# Patient Record
Sex: Female | Born: 1970 | Race: White | Hispanic: No | Marital: Married | Smoking: Current every day smoker
Health system: Southern US, Community
[De-identification: ages and names within clinical notes are randomized; demographics above are authoritative.]

## PROBLEM LIST (undated history)

## (undated) DIAGNOSIS — M1990 Unspecified osteoarthritis, unspecified site: Secondary | ICD-10-CM

## (undated) DIAGNOSIS — E119 Type 2 diabetes mellitus without complications: Secondary | ICD-10-CM

## (undated) HISTORY — DX: Type 2 diabetes mellitus without complications: E11.9

## (undated) HISTORY — PX: ABDOMINAL HYSTERECTOMY: SHX81

---

## 2016-12-11 ENCOUNTER — Emergency Department (INDEPENDENT_AMBULATORY_CARE_PROVIDER_SITE_OTHER)
Admission: EM | Admit: 2016-12-11 | Discharge: 2016-12-11 | Disposition: A | Discharge status: Home / Self Care | Payer: BLUE CROSS/BLUE SHIELD | Source: Home / Self Care | Attending: Family Medicine | Admitting: Family Medicine

## 2016-12-11 ENCOUNTER — Encounter: Payer: Self-pay | Admitting: *Deleted

## 2016-12-11 ENCOUNTER — Emergency Department: Payer: BLUE CROSS/BLUE SHIELD

## 2016-12-11 ENCOUNTER — Telehealth: Payer: Self-pay | Admitting: *Deleted

## 2016-12-11 DIAGNOSIS — M7989 Other specified soft tissue disorders: Secondary | ICD-10-CM

## 2016-12-11 DIAGNOSIS — E119 Type 2 diabetes mellitus without complications: Secondary | ICD-10-CM

## 2016-12-11 DIAGNOSIS — K219 Gastro-esophageal reflux disease without esophagitis: Secondary | ICD-10-CM

## 2016-12-11 DIAGNOSIS — L42 Pityriasis rosea: Secondary | ICD-10-CM

## 2016-12-11 DIAGNOSIS — M797 Fibromyalgia: Secondary | ICD-10-CM | POA: Diagnosis not present

## 2016-12-11 DIAGNOSIS — E8941 Symptomatic postprocedural ovarian failure: Secondary | ICD-10-CM

## 2016-12-11 HISTORY — DX: Unspecified osteoarthritis, unspecified site: M19.90

## 2016-12-11 LAB — POCT URINALYSIS DIP (MANUAL ENTRY)
BILIRUBIN UA: NEGATIVE
BILIRUBIN UA: NEGATIVE mg/dL
Blood, UA: NEGATIVE
GLUCOSE UA: NEGATIVE mg/dL
LEUKOCYTES UA: NEGATIVE
NITRITE UA: NEGATIVE
PH UA: 5.5 (ref 5.0–8.0)
Protein Ur, POC: NEGATIVE mg/dL
Spec Grav, UA: 1.025 (ref 1.010–1.025)
Urobilinogen, UA: 0.2 E.U./dL

## 2016-12-11 LAB — POCT CBC W AUTO DIFF (K'VILLE URGENT CARE)

## 2016-12-11 LAB — COMPLETE METABOLIC PANEL WITH GFR
ALBUMIN: 3.3 g/dL — AB (ref 3.6–5.1)
ALT: 24 U/L (ref 6–29)
AST: 21 U/L (ref 10–35)
Alkaline Phosphatase: 80 U/L (ref 33–115)
BUN: 9 mg/dL (ref 7–25)
CALCIUM: 9.3 mg/dL (ref 8.6–10.2)
CHLORIDE: 105 mmol/L (ref 98–110)
CO2: 29 mmol/L (ref 20–31)
CREATININE: 0.77 mg/dL (ref 0.50–1.10)
GFR, Est African American: >89 mL/min (ref >=60)
GFR, Est Non African American: >89 mL/min (ref >=60)
GLUCOSE: 90 mg/dL (ref 65–99)
Potassium: 5.3 mmol/L (ref 3.5–5.3)
SODIUM: 144 mmol/L (ref 135–146)
Total Bilirubin: 0.2 mg/dL (ref 0.2–1.2)
Total Protein: 6.4 g/dL (ref 6.1–8.1)

## 2016-12-11 LAB — TSH: TSH: 2.77 m[IU]/L

## 2016-12-11 MED ORDER — PREGABALIN 25 MG PO CAPS: ORAL_CAPSULE | ORAL | 1 refills | Status: DC

## 2016-12-11 MED ORDER — METFORMIN HCL 500 MG PO TABS: 500.0000 mg | ORAL_TABLET | Freq: Two times a day (BID) | ORAL | 1 refills | Status: AC

## 2016-12-11 MED ORDER — ESOMEPRAZOLE MAGNESIUM 40 MG PO CPDR: DELAYED_RELEASE_CAPSULE | ORAL | 1 refills | Status: AC

## 2016-12-11 MED ORDER — GABAPENTIN 300 MG PO CAPS: 300.0000 mg | ORAL_CAPSULE | Freq: Every day | ORAL | 0 refills | Status: DC

## 2016-12-11 NOTE — ED Triage Notes (Signed)
Patient c/o of a "several month" h/o right leg pain and swelling, generalized body aches, pelvic pain and hot/cold flashes. Reports previous diagnosis of diabetes and fibromyalgia. She has not been able to get any Rx because she has been uninsured.

## 2016-12-11 NOTE — Telephone Encounter (Signed)
Patient would like Lyrica changed to alternate medication as she does not wish to wait on prior authorization request. Per Dr. Cathren HarshBeese, change to Gabapentin 300mg  1 daily. #15. F/u with new PCP for further refills

## 2016-12-11 NOTE — ED Provider Notes (Addendum)
[facility]    CSN: [id number] Arrival date & time: 12/11/16  1138     History   Chief Complaint Chief Complaint  Patient presents with  . Generalized Body Aches  . Pelvic Pain  . Leg Swelling    HPI Khadija Thier is a 46 y.o. female.   Patient presents with her mother with numerous complaints.  She lost her health insurance about a year ago, and has been unable to obtain medications/treatment/evaluation for some of her problems.  She is seeking a PCP to manage her medical conditions. 1)  Type 2 diabetes.  Patient had been started on metformin about a year ago, but does not remember her dosage or response.  She also does not remember her Hgb A1c.  She admits that recently she has developed urinary frequency and nocturia without dysuria.  Her weight has increased from about 140 pounds a year ago to 217 pounds today.  She complains of vague blurred vision. 2)  Fibromyalgia.  She complains of chronic upper and lower back pain.  She had been prescribed a combination of Cymbalta and Lyrica in the past, but recalls that she did not like how she felt while on the meds (she does not recall the dosages).  She presently is taking Pristiq 50mg  daily.  She is also receiving Percocet 10-325, one tab TID through a pain clinic [facility] Marijean Niemann., MD).  According to the [facility], she last received a 30 day supply (90 tabs) on 12/03/16. 3)  During the past 2 weeks she has had persistent pain/swelling in her right lower leg.  She recalls no injury. 4)  GERD.  She has recurrent reflux symptoms.  She has a history of LES stricture requiring dilation.  She has had good response in the past to Nexium. 5)  Rheumatoid Arthritis.  She states that testing has revealed that she has RA.  She complains of chronic soreness in her hands and wrists.  She does not remember what she has taken in the past for RA. 6)  Hot Flashes.  She complains of frequent hot flashes.  She has a past history of TAH/OOP.  She had been  prescribed an estrogen preparation but did not like its effects. 7)  Rash on abdomen.  She noted the appearance of an erythematous round lesion on her right abdomen recently that "burns" but is not pruritic.  Later she noticed the appearance of several smaller lesions on her abdomen. 8)  Recurring rash on buttocks.  She notes that occasionally when she is stressed or anxious, a painful vesicular rash appears on her lower back and/or buttocks (not present now). 9)  Anxiety.  Presently taking Pristiq 50mg  daily.  She is prescribed Xanax 1mg , BID prn.    The history is provided by the patient and a parent.    Past Medical History:  Diagnosis Date  . Arthritis     There are no active problems to display for this patient.   Past Surgical History:  Procedure Laterality Date  . ABDOMINAL HYSTERECTOMY      OB History    No data available       Home Medications    Prior to Admission medications   Medication Sig Start Date End Date Taking? Authorizing Provider  ALPRAZolam Prudy Feeler) 1 MG tablet Take 1 mg by mouth at bedtime as needed for anxiety.   Yes Historical Provider, MD  desvenlafaxine (PRISTIQ) 50 MG 24 hr tablet Take 50 mg by mouth daily.   Yes  Historical Provider, MD  oxyCODONE-acetaminophen (PERCOCET) 10-325 MG tablet Take 1 tablet by mouth 3 (three) times daily.   Yes Historical Provider, MD  esomeprazole (NEXIUM) 40 MG capsule Take one tab PO daily, 30 minutes [facility] 12/11/16   Lattie Haw, MD  metFORMIN (GLUCOPHAGE) 500 MG tablet Take 1 tablet (500 mg total) by mouth 2 (two) times daily with a meal. 12/11/16   Lattie Haw, MD  pregabalin (LYRICA) 25 MG capsule Take one cap PO HS 12/11/16   Lattie Haw, MD    Family History Family History  Problem Relation Age of Onset  . Diabetes Mother   . Hyperlipidemia Mother   . Hypertension Mother     Social History Social History  Substance Use Topics  . Smoking status: Current Every Day Smoker    Packs/day: 1.00     Types: Cigarettes  . Smokeless tobacco: Never Used  . Alcohol use No     Allergies   Patient has no known allergies.   Review of Systems Review of Systems  Constitutional: Positive for activity change, appetite change and fatigue. Negative for chills, diaphoresis, fever and unexpected weight change.  HENT: Negative.   Eyes:       Vision blurred at times  Respiratory: Negative.   Cardiovascular: Positive for leg swelling. Negative for chest pain and palpitations.  Gastrointestinal:       Reflux symptoms  Endocrine: Positive for polyuria.  Genitourinary: Positive for frequency. Negative for dysuria, hematuria and urgency.  Musculoskeletal: Positive for arthralgias, back pain and joint swelling.  Skin: Positive for rash.  Neurological: Negative.   Psychiatric/Behavioral: The patient is nervous/anxious.      Physical Exam Triage Vital Signs ED Triage Vitals [12/11/16 1215]  Enc Vitals Group     BP (!) 151/69     Pulse Rate 99     Resp 16     Temp 98.3 F (36.8 C)     Temp Source Oral     SpO2 98 %     Weight 217 lb (98.4 kg)     Height      Head Circumference      Peak Flow      Pain Score 8     Pain Loc      Pain Edu?      Excl. in GC?    No data found.   Updated Vital Signs BP (!) 151/69 (BP Location: Left Arm)   Pulse 99   Temp 98.3 F (36.8 C) (Oral)   Resp 16   Wt 217 lb (98.4 kg)   SpO2 98%   Visual Acuity Right Eye Distance:   Left Eye Distance:   Bilateral Distance:    Right Eye Near:   Left Eye Near:    Bilateral Near:     Physical Exam  Constitutional: She appears well-developed and well-nourished. No distress.  HENT:  Head: Normocephalic and atraumatic.  Right Ear: Tympanic membrane, external ear and ear canal normal.  Left Ear: Tympanic membrane, external ear and ear canal normal.  Nose: Nose normal.  Mouth/Throat: Oropharynx is clear and moist.  Eyes: Conjunctivae and EOM are normal. Pupils are equal, round, and reactive to light.   Neck: Normal range of motion. No thyromegaly present.  Cardiovascular: Normal heart sounds.   Pulmonary/Chest: Breath sounds normal.  Abdominal: Bowel sounds are normal. She exhibits no mass. There is no tenderness.    There is a salmon-colored oval lesion measuring about  2 cm by 3cm diameter  on patient's right abdomen.  The lesion is slightly raised with a collarette of fine scale.  There is a smaller oval slightly raised salmon-colored lesion on her right abdomen.    Musculoskeletal: She exhibits tenderness.       Right lower leg: She exhibits tenderness and swelling.  Right lower leg is slightly swollen compared to the left, and diffuse posterior calf tenderness present.  Pedal pulses intact.  Lymphadenopathy:    She has no cervical adenopathy.  Neurological: She is alert.  Skin: Skin is warm and dry. Rash noted.  Psychiatric: She has a normal mood and affect.  Nursing note and vitals reviewed.    UC Treatments / Results  Labs (all labs ordered are listed, but only abnormal results are displayed) Labs Reviewed  COMPLETE METABOLIC PANEL WITH GFR  TSH  HEMOGLOBIN A1C  POCT CBC W AUTO DIFF ([facility]):  WBC 7.2; LY 43.1; MO 4.7; GR 52.2; Hgb 13.9; Platelets 280   POCT URINALYSIS DIP (MANUAL ENTRY):  negative    EKG  EKG Interpretation None       Radiology [place] Venous Img Lower Unilateral Right  Result Date: 12/11/2016 CLINICAL DATA:  Right leg pain and swelling EXAM: Right LOWER EXTREMITY VENOUS DOPPLER ULTRASOUND TECHNIQUE: Gray-scale sonography with graded compression, as well as color Doppler and duplex ultrasound were performed to evaluate the lower extremity deep venous systems from the level of the common femoral vein and including the common femoral, femoral, profunda femoral, popliteal and calf veins including the posterior tibial, peroneal and gastrocnemius veins when visible. The superficial great saphenous vein was also interrogated. Spectral Doppler was  utilized to evaluate flow at rest and with distal augmentation maneuvers in the common femoral, femoral and popliteal veins. COMPARISON:  None. FINDINGS: Contralateral Common Femoral Vein: Respiratory phasicity is normal and symmetric with the symptomatic side. No evidence of thrombus. Normal compressibility. Common Femoral Vein: No evidence of thrombus. Normal compressibility, respiratory phasicity and response to augmentation. Saphenofemoral Junction: No evidence of thrombus. Normal compressibility and flow on color Doppler imaging. Profunda Femoral Vein: No evidence of thrombus. Normal compressibility and flow on color Doppler imaging. Femoral Vein: No evidence of thrombus. Normal compressibility, respiratory phasicity and response to augmentation. Popliteal Vein: No evidence of thrombus. Normal compressibility, respiratory phasicity and response to augmentation. Calf Veins: No evidence of thrombus. Normal compressibility and flow on color Doppler imaging. Superficial Great Saphenous Vein: No evidence of thrombus. Normal compressibility and flow on color Doppler imaging. Venous Reflux:  None. Other Findings:  None. IMPRESSION: No evidence of DVT within the  lower extremity. Electronically Signed   By: Marlan Palauharles  Clark M.D.   On: 12/11/2016 13:31    Procedures Procedures (including critical care time)  Medications Ordered in UC Medications - No data to display   Initial Impression / Assessment and Plan / UC Course  I have reviewed the triage vital signs and the nursing notes.  Pertinent labs & imaging results that were available during my care of the patient were reviewed by me and considered in my medical decision making (see chart for details).    No evidence DVT. CMP, Hgb A1C, CMP, TSH pending. Resume Metformin 500mg  BID (Rx written; await results Hgb A1C before starting). Continue Pristiq.  Rx written for low dose Lyrica at bedtime; however insurance would not cover.  Will try gabapentin at  bedtime for fibromyalgia. Resume Nexium 40mg  Daily. May apply 1% hydrocortisone cream twice daily to rash on abdomen. Followup with pain clinic as  scheduled. Followup with PCP to establish care as soon as possible.     Final Clinical Impressions(s) / UC Diagnoses   Final diagnoses:  Fibromyalgia  Type 2 diabetes, diet controlled (HCC)  Chronic GERD  Hot flashes due to surgical menopause  Pityriasis rosea  Leg swelling    New Prescriptions New Prescriptions   ESOMEPRAZOLE (NEXIUM) 40 MG CAPSULE    Take one tab PO daily, 30 minutes AC   METFORMIN (GLUCOPHAGE) 500 MG TABLET    Take 1 tablet (500 mg total) by mouth 2 (two) times daily with a meal.   PREGABALIN (LYRICA) 25 MG CAPSULE    Take one cap PO HS     Lattie Haw, MD 12/12/16 1610    Lattie Haw, MD 12/12/16 [phone]

## 2016-12-11 NOTE — Discharge Instructions (Signed)
May apply 1% hydrocortisone cream twice daily to rash on abdomen.

## 2016-12-12 ENCOUNTER — Telehealth: Payer: Self-pay | Admitting: *Deleted

## 2016-12-12 LAB — HEMOGLOBIN A1C
Hgb A1c MFr Bld: 6.4 % — ABNORMAL HIGH (ref <5.7)
MEAN PLASMA GLUCOSE: 137 mg/dL

## 2016-12-12 NOTE — Telephone Encounter (Signed)
Callback: Lab results given and discussed. She may start metformin. Strongly encouraged to establish with PCP asap.

## 2016-12-17 ENCOUNTER — Encounter: Payer: Self-pay | Admitting: Osteopathic Medicine

## 2016-12-17 ENCOUNTER — Ambulatory Visit (INDEPENDENT_AMBULATORY_CARE_PROVIDER_SITE_OTHER): Payer: BLUE CROSS/BLUE SHIELD | Admitting: Osteopathic Medicine

## 2016-12-17 VITALS — BP 120/79 | HR 94 | Ht 64.0 in | Wt 215.0 lb

## 2016-12-17 DIAGNOSIS — R7303 Prediabetes: Secondary | ICD-10-CM | POA: Insufficient documentation

## 2016-12-17 DIAGNOSIS — M2550 Pain in unspecified joint: Secondary | ICD-10-CM

## 2016-12-17 DIAGNOSIS — L42 Pityriasis rosea: Secondary | ICD-10-CM

## 2016-12-17 DIAGNOSIS — R52 Pain, unspecified: Secondary | ICD-10-CM

## 2016-12-17 DIAGNOSIS — M2560 Stiffness of unspecified joint, not elsewhere classified: Secondary | ICD-10-CM

## 2016-12-17 DIAGNOSIS — Z8719 Personal history of other diseases of the digestive system: Secondary | ICD-10-CM | POA: Insufficient documentation

## 2016-12-17 DIAGNOSIS — M797 Fibromyalgia: Secondary | ICD-10-CM

## 2016-12-17 LAB — CBC WITH DIFFERENTIAL/PLATELET
BASOS PCT: 0 %
Basophils Absolute: 0 cells/uL (ref 0–200)
EOS PCT: 2 %
Eosinophils Absolute: 128 cells/uL (ref 15–500)
HCT: 43.5 % (ref 35.0–45.0)
Hemoglobin: 14.4 g/dL (ref 11.7–15.5)
LYMPHS PCT: 43 %
Lymphs Abs: 2752 cells/uL (ref 850–3900)
MCH: 29.1 pg (ref 27.0–33.0)
MCHC: 33.1 g/dL (ref 32.0–36.0)
MCV: 88.1 fL (ref 80.0–100.0)
MONOS PCT: 6 %
MPV: 9.2 fL (ref 7.5–12.5)
Monocytes Absolute: 384 cells/uL (ref 200–950)
Neutro Abs: 3136 cells/uL (ref 1500–7800)
Neutrophils Relative %: 49 %
PLATELETS: 319 10*3/uL (ref 140–400)
RBC: 4.94 MIL/uL (ref 3.80–5.10)
RDW: 14.2 % (ref 11.0–15.0)
WBC: 6.4 10*3/uL (ref 3.8–10.8)

## 2016-12-17 LAB — LIPID PANEL
Cholesterol: 256 mg/dL — ABNORMAL HIGH (ref <200)
HDL: 53 mg/dL (ref >50)
LDL Cholesterol: 171 mg/dL — ABNORMAL HIGH (ref <100)
TRIGLYCERIDES: 161 mg/dL — AB (ref <150)
Total CHOL/HDL Ratio: 4.8 Ratio (ref <5.0)
VLDL: 32 mg/dL — ABNORMAL HIGH (ref <30)

## 2016-12-17 MED ORDER — PREGABALIN 75 MG PO CAPS: 75.0000 mg | ORAL_CAPSULE | Freq: Two times a day (BID) | ORAL | 1 refills | Status: AC

## 2016-12-17 MED ORDER — GABAPENTIN 300 MG PO CAPS: 600.0000 mg | ORAL_CAPSULE | Freq: Three times a day (TID) | ORAL | 1 refills | Status: DC

## 2016-12-17 NOTE — Patient Instructions (Addendum)
Plan:  Labs to check for other causes of pain  Called in Gabapentin, will work on getting Lyrica covered   Will plan to follow up in one week to discuss lab results and next steps  Will get records from Dr Marshall CorkPhan

## 2016-12-17 NOTE — Progress Notes (Signed)
HPI: Shari Hancock is a 46 y.o. female  who [facility],  for chief complaint of:  Chief Complaint  Patient presents with  . Establish Care    GENERALIZED BODY PAIN    New patient here to establish care. Several concerns but greatest among these is persistent generalized body pain.  . Context: No significant injury/illness at onset of her symptoms . Location & Quality: Generalized, particularly reporting muscle aches, significant fatigue, hand pain, neck pain, knee pain, hip pain, morning stiff joints . Duration: 3 or 4 years . Timing: constant, worse in the morning . [facility]arthritis neck, did full body scan 8 years ago and was told had beginnings of rheumatoid arthritis but she's not sure if any blood work was done to further evaluate this possible diagnosis. History of fibromyalgia, she states that [place], when I look up the name of that provider online he is listed as a psychiatrist.    Previous fibromyalgia treatments include... [place] [facility] [place] [facility] [place] ([place] Dr Daneil Danhai Phan (listed as psychiatry)  TCA: Amitriptyline No, Trazodone yes (was also on Ambien but [facility]) SSRI/SNRI: Duloxetine Yes - side effects of irritability and feeling severe fatigue  Muscle Relaxer: Cyclobenzaprine No, maybe other muscle relaxer in the past  Anticonvulsants: Gabapentin Yes, Pregabalin has been on this in the past which was helpful NSAID No physical activity minimal   Rash: Patch on front of abdomen seems to have spread to multiple sites on the rest of the trunk arms and legs. Occasionally itchy but overall doesn't bother her.  Esophageal: Hx EGD and dilatation, [place] [facility]. Notes significant acid reflux problems/heartburn. Early satiety, eating small meals.   Records reviewed: Hx Tx for OD 03/2015  "On some unknown white powder. CPR performed by sherriffs department for 5 to 6 mins. Narcan 2mg  started to respond. Responsive now but lethargic... Utox (+)Benzo, Opiate, Methadone  Patient is accompanied by mom who assists with history-taking.   Past medical, surgical, social and family history reviewed: There are no active problems to display for this patient.  Past Surgical History:  Procedure Laterality Date  . ABDOMINAL HYSTERECTOMY     Social History  Substance Use Topics  . Smoking status: Current Every Day Smoker    Packs/day: 1.00    Types: Cigarettes  . Smokeless tobacco: Never Used  . Alcohol use No   Family History  Problem Relation Age of Onset  . Diabetes Mother   . Hyperlipidemia Mother   . Hypertension Mother      Current medication list and allergy/intolerance information reviewed:   Current Outpatient Prescriptions  Medication Sig Dispense Refill  . ALPRAZolam (XANAX) 1 MG tablet Take 1 mg by mouth at bedtime as needed for anxiety.    Marland Kitchen. desvenlafaxine (PRISTIQ) 50 MG 24 hr tablet Take 50 mg by mouth daily.    Marland Kitchen. esomeprazole (NEXIUM) 40 MG capsule Take one tab PO daily, 30 minutes AC 15 capsule 1  . gabapentin (NEURONTIN) 300 MG capsule Take 1 capsule (300 mg total) by mouth daily. 15 capsule 0  . metFORMIN (GLUCOPHAGE) 500 MG tablet Take 1 tablet (500 mg total) by mouth 2 (two) times daily with a meal. 30 tablet 1  . oxyCODONE-acetaminophen (PERCOCET) 10-325 MG tablet Take 1 tablet by mouth 3 (three) times daily.  No current facility-administered medications for this visit.    No Known Allergies    Review of Systems:  Constitutional:  No  fever, no chills, No recent illness, No unintentional weight changes. No significant fatigue.   HEENT: +headache, no vision change, no hearing change, No sore throat, No  sinus pressure  Cardiac: No  chest pain, No  pressure, No palpitations, No  Orthopnea  Respiratory:  No  shortness of breath. No   Cough  Gastrointestinal: No  abdominal pain, No  nausea, No  vomiting,  No  blood in stool, No  diarrhea, No  constipation   Musculoskeletal: No new myalgia/arthralgia  Genitourinary: No  incontinence, No  abnormal genital bleeding, No abnormal genital discharge  Skin: No  Rash, No other wounds/concerning lesions  Hem/Onc: No  easy bruising/bleeding, No  abnormal lymph node  Endocrine: No cold intolerance,  No heat intolerance. No polyuria/polydipsia/polyphagia   Neurologic: +generalized weakness, No  dizziness, No  slurred speech/focal weakness/facial droop  Psychiatric: +concerns with depression, +concerns with anxiety, +sleep problems, No mood problems  Exam:  BP 120/79   Pulse 94   Ht 5\' 4"  (1.626 m)   Wt 215 lb (97.5 kg)   BMI 36.90 kg/m   Constitutional: VS see above. General Appearance: alert, well-developed, well-nourished, NAD  Eyes: Normal lids and conjunctive, non-icteric sclera  Ears, Nose, Mouth, Throat: MMM, Normal external inspection ears/nares/mouth/lips/gums. TM normal bilaterally. Pharynx/tonsils no erythema, no exudate. Nasal mucosa normal.   Neck: No masses, trachea midline.   Respiratory: Normal respiratory effort. no wheeze, no rhonchi, no rales  Cardiovascular: S1/S2 normal, no murmur, no rub/gallop auscultated. RRR. No lower extremity edema. Pedal pulse II/IV bilaterally DP and PT. No carotid bruit or JVD. No abdominal aortic bruit.  Gastrointestinal: Nontender, no masses. No hepatomegaly, no splenomegaly. No hernia appreciated. Bowel sounds normal. Rectal exam deferred.   Musculoskeletal: Gait normal. No clubbing/cyanosis of digits.   Neurological: Normal balance/coordination. No tremor. No cranial nerve deficit on limited exam. Motorintact and symmetric.  Skin: warm, dry, intact. Patch consistent with herald patch on anterior abdomen, no ulceration/drainage, similar small spots on trunk and arms consistent with pityriasis rosea  Psychiatric:  Normal judgment/insight. Normal mood and affect. Oriented x3.   Recent Results (from the past 2160 hour(s))  COMPLETE METABOLIC PANEL WITH GFR     Status: Abnormal   Collection Time: 12/11/16  1:29 PM  Result Value Ref Range   Sodium 144 135 - 146 mmol/L   Potassium 5.3 3.5 - 5.3 mmol/L   Chloride 105 98 - 110 mmol/L   CO2 29 20 - 31 mmol/L   Glucose, Bld 90 65 - 99 mg/dL   BUN 9 7 - 25 mg/dL   Creat 6.96 2.95 - 2.84 mg/dL   Total Bilirubin 0.2 0.2 - 1.2 mg/dL   Alkaline Phosphatase 80 33 - 115 U/L   AST 21 10 - 35 U/L   ALT 24 6 - 29 U/L   Total Protein 6.4 6.1 - 8.1 g/dL   Albumin 3.3 (L) 3.6 - 5.1 g/dL   Calcium 9.3 8.6 - 13.2 mg/dL   GFR, Est African American >89 >=60 mL/min   GFR, Est Non African American >89 >=60 mL/min  TSH     Status: None   Collection Time: 12/11/16  1:29 PM  Result Value Ref Range   TSH 2.77 mIU/L    Comment:   Reference Range   > or = 20 Years  0.40-4.50   Pregnancy Range First trimester  0.26-2.66 Second trimester 0.55-2.73 Third trimester  0.43-2.91     Hemoglobin A1c     Status: Abnormal   Collection Time: 12/11/16  1:29 PM  Result Value Ref Range   Hgb A1c MFr Bld 6.4 (H) <5.7 %    Comment:   For someone without known diabetes, a hemoglobin A1c value between 5.7% and 6.4% is consistent with prediabetes and should be confirmed with a follow-up test.   For someone with known diabetes, a value <7% indicates that their diabetes is well controlled. A1c targets should be individualized based on duration of diabetes, age, co-morbid conditions and other considerations.   This assay result is consistent with an increased risk of diabetes.   Currently, no consensus exists regarding use of hemoglobin A1c for diagnosis of diabetes in children.      Mean Plasma Glucose 137 mg/dL  POCT CBC w auto diff     Status: None   Collection Time: 12/11/16  1:46 PM  Result Value Ref Range   WBC  4.5 - 10.5 K/uL    Comment: see scanned report    Lymphocytes relative %  15 - 45 %   Monocytes relative %  2 - 10 %   Neutrophils relative % (GR)  44 - 76 %   Lymphocytes absolute  0.1 - 1.8 K/uL   Monocyes absolute  0.1 - 1 K/uL   Neutrophils absolute (GR#)  1.7 - 7.8 K/uL   RBC  3.8 - 5.1 MIL/uL   Hemoglobin  11.8 - 15.5 g/dL   Hematocrit  16.1 - 46 %   MCV  78 - 100 fL   MCH  26 - 32 pg   MCHC  32 - 36.5 g/dL   RDW  09.6 - 14 %   Platelet count  140 - 400 K/uL   MPV  7.8 - 11 fL  POCT urinalysis dipstick     Status: None   Collection Time: 12/11/16  1:46 PM  Result Value Ref Range   Color, UA yellow yellow   Clarity, UA clear clear   Glucose, UA negative negative mg/dL   Bilirubin, UA negative negative   Ketones, POC UA negative negative mg/dL   Spec Grav, UA 0.454 0.981 - 1.025   Blood, UA negative negative   pH, UA 5.5 5.0 - 8.0   Protein Ur, POC negative negative mg/dL   Urobilinogen, UA 0.2 0.2 or 1.0 E.U./dL   Nitrite, UA Negative Negative   Leukocytes, UA Negative Negative    ASSESSMENT/PLAN:   Diffuse pain - Workup for possible rheumatologic disorder - Plan: ANA  Arthralgia, unspecified joint - Workup for possible rheumatologic disorder, 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 Await records - Plan: CBC with Differential/Platelet, Lipid panel, HIV antibody, VITAMIN D 25 Hydroxy (Vit-D Deficiency, Fractures), ANA, CK, High sensitivity CRP, Rheumatoid factor, Sedimentation rate, ANA  Fibromyalgia - Await records from pain management. Advised optimization of diet/exercise - Plan: gabapentin (NEURONTIN) 300 MG capsule, pregabalin (LYRICA) 75 MG capsule, ANA  Morning stiffness of joints - Workup for possible rheumatologic disorder  Prediabetes - Plan: CBC with Differential/Platelet  History of esophageal stricture  Pityriasis rosea - Advised no intervention needed/helpful. Monitor. Consider biopsy if no improvement    Patient Instructions  Plan:  Labs to check for other causes of  pain  Called in Gabapentin, will work on getting Lyrica covered   Will plan to follow up in one week to discuss lab results and next steps  Will get  records from Dr Marshall Cork      Visit summary with medication list and pertinent instructions was printed for patient to review. All questions at time of visit were answered - patient instructed to contact office with any additional concerns. ER/RTC precautions were reviewed with the patient. Follow-up plan: Return in about 9 days (around 12/26/2016) for review labs, sooner if needed.  Note: Total time spent 45 minutes, greater than 50% of the visit was spent face-to-face counseling and coordinating care for the following: The primary encounter diagnosis was Diffuse pain. Diagnoses of Arthralgia, unspecified joint, Fibromyalgia, Morning stiffness of joints, Prediabetes, History of esophageal stricture, and Pityriasis rosea were also pertinent to this visit.Marland Kitchen

## 2016-12-18 LAB — RHEUMATOID FACTOR: RHEUMATOID FACTOR: 36 [IU]/mL — AB (ref <14)

## 2016-12-18 LAB — CK: CK TOTAL: 30 U/L (ref 7–177)

## 2016-12-18 LAB — VITAMIN D 25 HYDROXY (VIT D DEFICIENCY, FRACTURES): Vit D, 25-Hydroxy: 32 ng/mL (ref 30–100)

## 2016-12-18 LAB — HIGH SENSITIVITY CRP: CRP HIGH SENSITIVITY: 5.5 mg/L — AB

## 2016-12-18 LAB — HIV ANTIBODY (ROUTINE TESTING W REFLEX): HIV 1&2 Ab, 4th Generation: NONREACTIVE

## 2016-12-18 LAB — ANA: ANA: NEGATIVE

## 2016-12-18 LAB — SEDIMENTATION RATE: Sed Rate: 4 mm/hr (ref 0–20)

## 2016-12-26 ENCOUNTER — Ambulatory Visit (INDEPENDENT_AMBULATORY_CARE_PROVIDER_SITE_OTHER): Payer: BLUE CROSS/BLUE SHIELD | Admitting: Osteopathic Medicine

## 2016-12-26 ENCOUNTER — Encounter: Payer: Self-pay | Admitting: Osteopathic Medicine

## 2016-12-26 VITALS — BP 116/73 | HR 97 | Ht 64.0 in | Wt 213.0 lb

## 2016-12-26 DIAGNOSIS — M083 Juvenile rheumatoid polyarthritis (seronegative): Secondary | ICD-10-CM | POA: Diagnosis not present

## 2016-12-26 DIAGNOSIS — E7800 Pure hypercholesterolemia, unspecified: Secondary | ICD-10-CM

## 2016-12-26 DIAGNOSIS — M0580 Other rheumatoid arthritis with rheumatoid factor of unspecified site: Secondary | ICD-10-CM

## 2016-12-26 DIAGNOSIS — M2560 Stiffness of unspecified joint, not elsewhere classified: Secondary | ICD-10-CM | POA: Diagnosis not present

## 2016-12-26 DIAGNOSIS — R52 Pain, unspecified: Secondary | ICD-10-CM

## 2016-12-26 DIAGNOSIS — M059 Rheumatoid arthritis with rheumatoid factor, unspecified: Secondary | ICD-10-CM

## 2016-12-26 DIAGNOSIS — R7982 Elevated C-reactive protein (CRP): Secondary | ICD-10-CM | POA: Diagnosis not present

## 2016-12-26 DIAGNOSIS — R7303 Prediabetes: Secondary | ICD-10-CM

## 2016-12-26 MED ORDER — PREDNISONE 5 MG PO TABS: ORAL_TABLET | ORAL | 0 refills | Status: DC

## 2016-12-26 MED ORDER — FOLIC ACID 1 MG PO TABS: 1.0000 mg | ORAL_TABLET | Freq: Every day | ORAL | 3 refills | Status: AC

## 2016-12-26 MED ORDER — METHOTREXATE SODIUM 15 MG PO TABS: 15.0000 mg | ORAL_TABLET | ORAL | 0 refills | Status: DC

## 2016-12-26 NOTE — Patient Instructions (Addendum)
Based on labs and symptoms, I suspect a possibility of Rheumatoid Arthritis. We are starting medicines for this and need to come back to lab in 2 weeks and follow up with me in the office in 4 weeks     Rheumatoid Arthritis Rheumatoid arthritis (RA) is a long-term (chronic) disease that causes inflammation in your joints. RA may start slowly. It usually affects the small joints of the hands and feet. Usually, the same joints are affected on both sides of your body. Inflammation from RA can also affect other parts of your body, including your heart, eyes, or lungs. RA is an autoimmune disease. That means that your body's defense system (immune system) mistakenly attacks healthy body tissues. There is no cure for RA, but medicines can help your symptoms and halt or slow down the progression of the disease. What are the causes? The exact cause of RA is not known. What increases the risk? This condition is more likely to develop in:  Women.  People who have a family history of RA or other autoimmune diseases. What are the signs or symptoms? Symptoms of this condition vary from person to person. Symptoms usually start gradually. They are often worse in the morning. The first symptom may be morning stiffness that lasts longer than 30 minutes. As RA progresses, symptoms may include:  Pain, stiffness, swelling, warmth, and tenderness in joints on both sides of your body.  Loss of energy.  Loss of appetite.  Weight loss.  Low-grade fever.  Dry eyes and dry mouth.  Firm lumps (rheumatoid nodules) that grow beneath your skin in areas such as your forearm bones near your elbows and on your hands.  Changes in the appearance of joints (deformity) and loss of joint function. Symptoms of RA often come and go. Sometimes, symptoms get worse for a period of time. These are called flares. How is this diagnosed? This condition is diagnosed based on your symptoms, medical history, and physical exam.  You may have X-rays or MRI to check for the type of joint changes that are caused by RA. You may also have blood tests to look for:  Proteins (antibodies) that your immune system may make if you have RA. They include rheumatoid factor (RF) and anti-CCP.  When blood tests show these proteins, you are said to have "seropositive RA."  When blood tests do not show these proteins, you may have "seronegative RA."  Inflammation in your blood.  A low number of red blood cells (anemia). How is this treated? The goals of treatment are to relieve pain, reduce inflammation, and slow down or stop joint damage and disability. Treatment may include:  Lifestyle changes. It is important to rest, eat a healthy diet, and exercise.  Medicines. Your health care provider may adjust your medicines every 3 months until treatment goals are reached. Common medicines include:  Pain relievers (analgesics).  Corticosteroids and NSAIDs to reduce inflammation.  Disease-modifying antirheumatic drugs (DMARDs) to try to slow the course of the disease.  Biologic response modifiers to reduce inflammation and damage.  Physical therapy and occupational therapy.  Surgery, if you have severe joint damage. Joint replacement or fusing of joints may be needed. Your health care provider will work with you to identify the best treatment option for you based on assessment of the overall disease activity in your body. Follow these instructions at home:  Take over-the-counter and prescription medicines only as told by your health care provider.  Start an exercise program as told by  your health care provider.  Rest when you are having a flare.  Return to your normal activities as told by your health care provider. Ask your health care provider what activities are safe for you.  Keep all follow-up visits as told by your health care provider. This is important. Where to find more information:  [employer]  [employer]: www.rheumatology.org  Arthritis Foundation: www.arthritis.org Contact a health care provider if:  You have a flare-up of RA symptoms.  You have a fever.  You have side effects from your medicines. Get help right away if:  You have chest pain.  You have trouble breathing.  You quickly develop a hot, painful joint that is more severe than your usual joint aches. This information is not intended to replace advice given to you by your health care provider. Make sure you discuss any questions you have with your health care provider. Document Released: 07/25/2000 Document Revised: 01/01/2016 Document Reviewed: 05/10/2015 Elsevier Interactive Patient Education  2017 [employer]

## 2016-12-26 NOTE — Progress Notes (Signed)
HPI: Shari Hancock is a 46 y.o. female  who presents to [place] today, 12/26/16,  for chief complaint of:  Chief Complaint  Patient presents with  . Follow-up    LABS    Arthritis: Morning stiffness, fingers/wrist joint pain ongoing many years, neck and back pain, knee pain. Inflammatory markers were suspicious for possible rheumatoid arthritis. All labs were reviewed in detail with the patient, see results as noted below. Due to financial considerations, patient states she is unable to see specialist.   Prediabetes: Doing well on metformin, fasting blood sugar in normal range but A1c still a bit elevated. Patient states has been doing a little bit better with diet, really not much with exercise   Past medical history, surgical history, social history and family history reviewed.  Patient Active Problem List   Diagnosis Date Noted  . Diffuse pain 12/17/2016  . Arthralgia 12/17/2016  . Fibromyalgia 12/17/2016  . Morning stiffness of joints 12/17/2016  . Prediabetes 12/17/2016  . History of esophageal stricture 12/17/2016  . Pityriasis rosea 12/17/2016    Current medication list and allergy/intolerance information reviewed.   Current Outpatient Prescriptions on File Prior to Visit  Medication Sig Dispense Refill  . ALPRAZolam (XANAX) 1 MG tablet Take 1 mg by mouth at bedtime as needed for anxiety.    Marland Kitchen desvenlafaxine (PRISTIQ) 50 MG 24 hr tablet Take 50 mg by mouth daily.    Marland Kitchen esomeprazole (NEXIUM) 40 MG capsule Take one tab PO daily, 30 minutes AC 15 capsule 1  . gabapentin (NEURONTIN) 300 MG capsule Take 2 capsules (600 mg total) by mouth 3 (three) times daily. 180 capsule 1  . metFORMIN (GLUCOPHAGE) 500 MG tablet Take 1 tablet (500 mg total) by mouth 2 (two) times daily with a meal. 30 tablet 1  . oxyCODONE-acetaminophen (PERCOCET) 10-325 MG tablet Take 1 tablet by mouth 3 (three) times daily.    . pregabalin (LYRICA) 75 MG capsule Take 1  capsule (75 mg total) by mouth 2 (two) times daily. 60 capsule 1   No current facility-administered medications on file prior to visit.    No Known Allergies    Review of Systems:  Constitutional: No recent illness  HEENT: No  headache,  Cardiac: No  chest pain, No  pressure No palpitations,  Respiratory:  No  shortness of breath. No  Cough  Musculoskeletal: No new myalgia/arthralgia, chronic pain as per history of present illness  Skin: No  Rash  Neurologic: No  weakness, No  Dizziness   Exam:  BP 116/73   Pulse 97   Ht 5' 4"  (1.626 m)   Wt 213 lb (96.6 kg)   BMI 36.56 kg/m   Constitutional: VS see above. General Appearance: alert, well-developed, well-nourished, NAD  Eyes: Normal lids and conjunctive, non-icteric sclera  Ears, Nose, Mouth, Throat: MMM, Normal external inspection ears/nares/mouth/lips/gums.  Neck: No masses, trachea midline.   Respiratory: Normal respiratory effort. no wheeze, no rhonchi, no rales  Cardiovascular: S1/S2 normal, no murmur, no rub/gallop auscultated. RRR.   Musculoskeletal: Gait normal. Symmetric and independent movement of all extremities  Neurological: Normal balance/coordination. No tremor.  Skin: warm, dry, intact.   Psychiatric: Normal judgment/insight. Normal mood and affect. Oriented x3.    Recent Results (from the past 2160 hour(s))  COMPLETE METABOLIC PANEL WITH GFR     Status: Abnormal   Collection Time: 12/11/16  1:29 PM  Result Value Ref Range   Sodium 144 135 - 146 mmol/L  Potassium 5.3 3.5 - 5.3 mmol/L   Chloride 105 98 - 110 mmol/L   CO2 29 20 - 31 mmol/L   Glucose, Bld 90 65 - 99 mg/dL   BUN 9 7 - 25 mg/dL   Creat 0.77 0.50 - 1.10 mg/dL   Total Bilirubin 0.2 0.2 - 1.2 mg/dL   Alkaline Phosphatase 80 33 - 115 U/L   AST 21 10 - 35 U/L   ALT 24 6 - 29 U/L   Total Protein 6.4 6.1 - 8.1 g/dL   Albumin 3.3 (L) 3.6 - 5.1 g/dL   Calcium 9.3 8.6 - 10.2 mg/dL   GFR, Est African American >89 >=60 mL/min    GFR, Est Non African American >89 >=60 mL/min  TSH     Status: None   Collection Time: 12/11/16  1:29 PM  Result Value Ref Range   TSH 2.77 mIU/L    Comment:     Hemoglobin A1c     Status: Abnormal   Collection Time: 12/11/16  1:29 PM  Result Value Ref Range   Hgb A1c MFr Bld 6.4 (H) <5.7 %    Comment:      Mean Plasma Glucose 137 mg/dL  POCT CBC w auto diff     Status: None   Collection Time: 12/11/16  1:46 PM  Result Value Ref Range   WBC  4.5 - 10.5 K/uL    Comment: see scanned report   Lymphocytes relative %  15 - 45 %   Monocytes relative %  2 - 10 %   Neutrophils relative % (GR)  44 - 76 %   Lymphocytes absolute  0.1 - 1.8 K/uL   Monocyes absolute  0.1 - 1 K/uL   Neutrophils absolute (GR#)  1.7 - 7.8 K/uL   RBC  3.8 - 5.1 MIL/uL   Hemoglobin  11.8 - 15.5 g/dL   Hematocrit  34.8 - 46 %   MCV  78 - 100 fL   MCH  26 - 32 pg   MCHC  32 - 36.5 g/dL   RDW  11.6 - 14 %   Platelet count  140 - 400 K/uL   MPV  7.8 - 11 fL  POCT urinalysis dipstick     Status: None   Collection Time: 12/11/16  1:46 PM  Result Value Ref Range   Color, UA yellow yellow   Clarity, UA clear clear   Glucose, UA negative negative mg/dL   Bilirubin, UA negative negative   Ketones, POC UA negative negative mg/dL   Spec Grav, UA 1.025 1.010 - 1.025   Blood, UA negative negative   pH, UA 5.5 5.0 - 8.0   Protein Ur, POC negative negative mg/dL   Urobilinogen, UA 0.2 0.2 or 1.0 E.U./dL   Nitrite, UA Negative Negative   Leukocytes, UA Negative Negative  CBC with Differential/Platelet     Status: None   Collection Time: 12/17/16 10:59 AM  Result Value Ref Range   WBC 6.4 3.8 - 10.8 K/uL   RBC 4.94 3.80 - 5.10 MIL/uL   Hemoglobin 14.4 11.7 - 15.5 g/dL   HCT 43.5 35.0 - 45.0 %   MCV 88.1 80.0 - 100.0 fL   MCH 29.1 27.0 - 33.0 pg   MCHC 33.1 32.0 - 36.0 g/dL   RDW 14.2 11.0 - 15.0 %   Platelets 319 140 - 400 K/uL   MPV 9.2 7.5 - 12.5 fL   Neutro Abs 3,136 1,500 - 7,800 cells/uL   Lymphs  Abs  2,752 850 - 3,900 cells/uL   Monocytes Absolute 384 200 - 950 cells/uL   Eosinophils Absolute 128 15 - 500 cells/uL   Basophils Absolute 0 0 - 200 cells/uL   Neutrophils Relative % 49 %   Lymphocytes Relative 43 %   Monocytes Relative 6 %   Eosinophils Relative 2 %   Basophils Relative 0 %   Smear Review Criteria for review not met   Lipid panel     Status: Abnormal   Collection Time: 12/17/16 10:59 AM  Result Value Ref Range   Cholesterol 256 (H) <200 mg/dL   Triglycerides 161 (H) <150 mg/dL   HDL 53 >50 mg/dL   Total CHOL/HDL Ratio 4.8 <5.0 Ratio   VLDL 32 (H) <30 mg/dL   LDL Cholesterol 171 (H) <100 mg/dL  HIV antibody     Status: None   Collection Time: 12/17/16 10:59 AM  Result Value Ref Range   HIV 1&2 Ab, 4th Generation NONREACTIVE NONREACTIVE    Comment:    VITAMIN D 25 Hydroxy (Vit-D Deficiency, Fractures)     Status: None   Collection Time: 12/17/16 10:59 AM  Result Value Ref Range   Vit D, 25-Hydroxy 32 30 - 100 ng/mL    Comment:   CK     Status: None   Collection Time: 12/17/16 10:59 AM  Result Value Ref Range   Total CK 30 7 - 177 U/L  High sensitivity CRP     Status: Abnormal   Collection Time: 12/17/16 10:59 AM  Result Value Ref Range   CRP, High Sensitivity 5.5 (H) mg/L    Comment:                                Cardio CRP Cutpoints      For ages > 17 years:          CRP mg/L            Risk         < 1.0      Lower relative cardiovascular risk according to                    AHA/CDC guidelines     1.0 - 3.0      Average relative cardiovascular risk according                    to AHA/CDC guidelines   > 3.0 - 10.0     Higher relative cardiovascular risk according to                    AHA/CDC guidelines.  Consider retesting in 1 to 2                    weeks to exclude a benign transient elevation in                    the baseline CRP value secondary to infection or                    inflammation.         > 10.0     Persistent elevation, upon  retesting, may be                    associated with infection and inflammation  according to AHA/CDC guidelines.   Rheumatoid factor     Status: Abnormal   Collection Time: 12/17/16 10:59 AM  Result Value Ref Range   Rhuematoid fact SerPl-aCnc 36 (H) <14 IU/mL  Sedimentation rate     Status: None   Collection Time: 12/17/16 10:59 AM  Result Value Ref Range   Sed Rate 4 0 - 20 mm/hr  ANA     Status: None   Collection Time: 12/17/16 11:01 AM  Result Value Ref Range   Anit Nuclear Antibody(ANA) NEG NEGATIVE    No results found.  No flowsheet data found.  Depression screen PHQ 2/9 12/17/2016  Decreased Interest 3  Down, Depressed, Hopeless 2  PHQ - 2 Score 5  Altered sleeping 3  Tired, decreased energy 3  Change in appetite 1  Feeling bad or failure about yourself  3  Trouble concentrating 3  Moving slowly or fidgety/restless 3  Suicidal thoughts 0  PHQ-9 Score 21      ASSESSMENT/PLAN: We'll cancel referral to rheumatology for now, start short course steroids for symptom control and start methotrexate for DMARD. Patient advised on importance of routine lab monitoring particularly with initiation of medications. Patient advised if side effects of medication or inadequate control, will refer at that point to specialist.   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    Patient Instructions  Based on labs and symptoms, I suspect a possibility of Rheumatoid Arthritis. We are starting medicines for this and need to come back to lab in 2 weeks and follow up with me in the office in 4 weeks     Rheumatoid Arthritis Rheumatoid arthritis (RA) is a long-term (chronic) disease that causes inflammation in your joints. RA may start slowly. It usually affects the small joints of the hands and feet. Usually, the same joints are affected on both sides of your body. Inflammation from RA can also affect other parts of your body, including your heart, eyes, or lungs. RA is an autoimmune disease. That means that your body's defense system (immune system) mistakenly attacks healthy body tissues. There is no cure for RA, but medicines can help your symptoms and halt or slow down the progression of the disease. What are the causes? The exact cause of RA is not known. What increases the risk? This condition is more likely to develop in:  Women.  People who have a family history of RA or other autoimmune diseases. What are the signs or symptoms? Symptoms of this condition vary from person to person. Symptoms usually start gradually. They are often worse in the morning. The first symptom may be morning stiffness that lasts longer than 30 minutes. As RA progresses, symptoms may include:  Pain, stiffness, swelling, warmth, and tenderness in joints on both sides of your body.  Loss of energy.  Loss of appetite.  Weight loss.  Low-grade fever.  Dry eyes and dry mouth.  Firm lumps (rheumatoid nodules) that grow beneath  your skin in areas such as your forearm bones near your elbows and on your hands.  Changes in the appearance of joints (deformity) and loss of joint function. Symptoms of RA often come and go. Sometimes, symptoms get worse for a period of time. These are called flares. How is  this diagnosed? This condition is diagnosed based on your symptoms, medical history, and physical exam. You may have X-rays or MRI to check for the type of joint changes that are caused by RA. You may also have blood tests to look for:  Proteins (antibodies) that your immune system may make if you have RA. They include rheumatoid factor (RF) and anti-CCP.  When blood tests show these proteins, you are said to have "seropositive RA."  When blood tests do not show these proteins, you may have "seronegative RA."  Inflammation in your blood.  A low number of red blood cells (anemia). How is this treated? The goals of treatment are to relieve pain, reduce inflammation, and slow down or stop joint damage and disability. Treatment may include:  Lifestyle changes. It is important to rest, eat a healthy diet, and exercise.  Medicines. Your health care provider may adjust your medicines every 3 months until treatment goals are reached. Common medicines include:  Pain relievers (analgesics).  Corticosteroids and NSAIDs to reduce inflammation.  Disease-modifying antirheumatic drugs (DMARDs) to try to slow the course of the disease.  Biologic response modifiers to reduce inflammation and damage.  Physical therapy and occupational therapy.  Surgery, if you have severe joint damage. Joint replacement or fusing of joints may be needed. Your health care provider will work with you to identify the best treatment option for you based on assessment of the overall disease activity in your body. Follow these instructions at home:  Take over-the-counter and prescription medicines only as told by your health care provider.  Start an exercise program as told by your health care provider.  Rest when you are having a flare.  Return to your normal activities as told by your health care provider. Ask your health care provider what activities are safe for you.  Keep all follow-up visits as told by your  health care provider. This is important. Where to find more information:  [employer]: www.rheumatology.[place]: www.arthritis.org Contact a health care provider if:  You have a flare-up of RA symptoms.  You have a fever.  You have side effects from your medicines. Get help right away if:  You have chest pain.  You have trouble breathing.  You quickly develop a hot, painful joint that is more severe than your usual joint aches. This information is not intended to replace advice given to you by your health care provider. Make sure you discuss any questions you have with your health care provider. Document Released: 07/25/2000 Document Revised: 01/01/2016 Document Reviewed: 05/10/2015 Elsevier Interactive Patient Education  2017 [employer].     Follow-up plan: Return in about 4 weeks (around 01/23/2017) for recheck RA meds with Dr Sheppard Coil .  Visit summary with medication list and pertinent instructions was printed for patient to review, alert [place] if any changes needed. All questions at time of visit were answered - patient instructed to contact office with any additional concerns. ER/RTC precautions were reviewed with the patient and understanding verbalized.   Note: Total time spent 25 minutes, greater than 50% of the visit was spent face-to-face counseling and coordinating care for the following: The primary encounter diagnosis was  Rheumatoid arthritis with positive rheumatoid factor, involving unspecified site ([place]). 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

## 2016-12-27 DIAGNOSIS — M059 Rheumatoid arthritis with rheumatoid factor, unspecified: Secondary | ICD-10-CM | POA: Insufficient documentation

## 2017-01-07 ENCOUNTER — Encounter: Payer: BLUE CROSS/BLUE SHIELD | Admitting: Family Medicine

## 2017-01-08 ENCOUNTER — Ambulatory Visit (INDEPENDENT_AMBULATORY_CARE_PROVIDER_SITE_OTHER): Payer: BLUE CROSS/BLUE SHIELD

## 2017-01-08 ENCOUNTER — Ambulatory Visit (INDEPENDENT_AMBULATORY_CARE_PROVIDER_SITE_OTHER): Payer: BLUE CROSS/BLUE SHIELD | Admitting: Family Medicine

## 2017-01-08 ENCOUNTER — Encounter: Payer: Self-pay | Admitting: Family Medicine

## 2017-01-08 ENCOUNTER — Ambulatory Visit (HOSPITAL_BASED_OUTPATIENT_CLINIC_OR_DEPARTMENT_OTHER): Payer: BLUE CROSS/BLUE SHIELD

## 2017-01-08 ENCOUNTER — Telehealth: Payer: Self-pay | Admitting: *Deleted

## 2017-01-08 VITALS — BP 123/81 | HR 92 | Wt 214.0 lb

## 2017-01-08 DIAGNOSIS — M25532 Pain in left wrist: Secondary | ICD-10-CM | POA: Diagnosis not present

## 2017-01-08 DIAGNOSIS — S59202A Unspecified physeal fracture of lower end of radius, left arm, initial encounter for closed fracture: Secondary | ICD-10-CM

## 2017-01-08 DIAGNOSIS — W19XXXA Unspecified fall, initial encounter: Secondary | ICD-10-CM | POA: Diagnosis not present

## 2017-01-08 DIAGNOSIS — S0993XA Unspecified injury of face, initial encounter: Secondary | ICD-10-CM | POA: Diagnosis not present

## 2017-01-08 NOTE — Progress Notes (Signed)
Shari Hancock is a 46 y.o. female who presents to [facility] today for face and wrist pain after a fall two days ago.  She tripped over a rug and hit the right side of her face and landed on her left wrist in flexion.  She has been resting her injured wrist since the incident without improvement.  She denies losing consciousness, headaches, changes in vision, N/V.   Past Medical History:  Diagnosis Date  . Arthritis   . Diabetes mellitus without complication [facility](HCC)    Past Surgical History:  Procedure Laterality Date  . ABDOMINAL HYSTERECTOMY     Social History  Substance Use Topics  . Smoking status: Current Every Day Smoker    Packs/day: 1.00    Types: Cigarettes  . Smokeless tobacco: Never Used  . Alcohol use No     ROS:  As above   Medications: Current Outpatient Prescriptions  Medication Sig Dispense Refill  . ALPRAZolam (XANAX) 1 MG tablet Take 1 mg by mouth at bedtime as needed for anxiety.    Marland Kitchen. desvenlafaxine (PRISTIQ) 50 MG 24 hr tablet Take 50 mg by mouth daily.    Marland Kitchen. esomeprazole (NEXIUM) 40 MG capsule Take one tab PO daily, 30 minutes AC 15 capsule 1  . folic acid (FOLVITE) 1 MG tablet Take 1 tablet (1 mg total) by mouth daily. Except day you take methotrexate 30 tablet 3  . gabapentin (NEURONTIN) 300 MG capsule Take 2 capsules (600 mg total) by mouth 3 (three) times daily. 180 capsule 1  . metFORMIN (GLUCOPHAGE) 500 MG tablet Take 1 tablet (500 mg total) by mouth 2 (two) times daily with a meal. 30 tablet 1  . methotrexate (RHEUMATREX) 15 MG tablet Take 1 tablet (15 mg total) by mouth once a week. Caution: Chemotherapy. Protect from light. 4 tablet 0  . oxyCODONE-acetaminophen (PERCOCET) 10-325 MG tablet Take 1 tablet by mouth 3 (three) times daily.    . predniSONE (DELTASONE) 5 MG tablet Take 2 tablets once per day by mouth for one week, then one tablet once per day by mouth for one week 30 tablet 0  . pregabalin  (LYRICA) 75 MG capsule Take 1 capsule (75 mg total) by mouth 2 (two) times daily. 60 capsule 1   No current facility-administered medications for this visit.    No Known Allergies   Exam:  BP 123/81   Pulse 92   Wt 214 lb (97.1 kg)   BMI 36.73 kg/m  General: Well Developed, well nourished, and in no acute distress.  Neuro/Psych: Alert and oriented x3, able to move all 4 extremities, sensation grossly intact. CN II-XII intact.  Mild Diplopia with extraocular movement.   Skin: Warm and dry, no rashes noted. No ecchymosis right face Respiratory: Not using accessory muscles, speaking in full sentences, trachea midline.  Cardiovascular: Pulses palpable, no extremity edema. Abdomen: Does not appear distended. MSK: Right sided facial point tenderness lateral to orbit with two minor lacerations.  Left wrist is swollen on volar aspect, tender to palpitation at radial and ulnar stylus. Remainder of exam limited by pain.  X-ray right wrist shows a nondisplaced fracture at the distal radius and possible fractured the ulnar styloid. 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  Awaiting formal radiology review    Assessment and Plan: 46 y.o. female with left wrist pain and right lateral facial pain after a fall 2 days ago.   Xray of wrist suggested likely fracture of radial and ulnar stylus, immobilized  by splint in clinic with recheck in 1 week.  Possible traumatic tenosynovitis as well, will recheck on follow-up.  Patient declined orbit CT as indicated by point tenderness on exam and diplopia with extraocular movement for financial reasons.  Less concerned about hematoma or TBI with normal neuro exam and in absence of headache. Watchful waiting.     Orders Placed This Encounter  Procedures  . DG Wrist Complete Left    Standing Status:   Future    Number of Occurrences:   1    Standing Expiration Date:   03/10/2018    Order Specific Question:   Reason for Exam (SYMPTOM  OR DIAGNOSIS REQUIRED)     Answer:   eval left wrist pain following fall    Order Specific Question:   Is patient pregnant?    Answer:   No    Order Specific Question:   Preferred imaging location?    Answer:   [facility]    Order Specific Question:   Radiology Contrast Protocol - do NOT remove file path    Answer:   \\[email or web address]  . CT ORBITS WO CONTRAST    Standing Status:   Future    Standing Expiration Date:   04/10/2018    Order Specific Question:   Reason for Exam (SYMPTOM  OR DIAGNOSIS REQUIRED)    Answer:   eval possible orbital fracture. Fell 2 days ago    Order Specific Question:   Is patient pregnant?    Answer:   No    Order Specific Question:   Preferred imaging location?    Answer:   [facility] Specific Question:   Radiology Contrast Protocol - do NOT remove file path    Answer:   \\[email or web address]   No orders of the defined types were placed in this encounter.   Discussed warning signs or symptoms. Please see discharge instructions. Patient expresses understanding.

## 2017-01-08 NOTE — Telephone Encounter (Signed)
closed

## 2017-01-08 NOTE — Patient Instructions (Addendum)
Thank you for coming in today. Go to [facility] today for CT scan.  Return for recheck in 1 week.  You can work as needed and able.   [address] [place], [place] [address]  Take oxycodone for pain as needed.    Cast or Splint Care, Adult Casts and splints are supports that are worn to protect broken bones and other injuries. A cast or splint may hold a bone still and in the correct position while it heals. Casts and splints may also help ease pain, swelling, and muscle spasms. A cast is a hardened support that is usually made of fiberglass or plaster. It is custom-fit to the body and it offers more protection than a splint. It cannot be taken off and put back on. A splint is a type of soft support that is usually made from cloth and elastic. It can be adjusted or taken off as needed. You may need a cast or a splint if you:  Have a broken bone.  Have a soft-tissue injury.  Need to keep an injured body part from moving (keep it immobile) after surgery.  How is this treated? If you have a cast:  Do not stick anything inside the cast to scratch your skin. Sticking something in the cast increases your risk of infection.  Check the skin around the cast every day. Tell your health care provider about any concerns.  You may put lotion on dry skin around the edges of the cast. Do not put lotion on the skin underneath the cast.  Keep the cast clean.  If the cast is not waterproof: ? Do not let it get wet. ? Cover it with a watertight covering when you take a bath or a shower. If you have a splint:  Wear it as told by your health care provider. Remove it only as told by your health care provider.  Loosen the splint if your fingers or toes tingle, become numb, or turn cold and blue.  Keep the splint clean.  If the splint is not waterproof: ? Do not let it get wet. ? Cover it with a watertight covering when you take a bath or a shower. Bathing  Do not take baths or  swim until your health care provider approves. Ask your health care provider if you can take showers. You may only be allowed to take sponge baths for bathing.  If your cast or splint is not waterproof, cover it with a watertight covering when you take a bath or shower. Managing pain, stiffness, and swelling  Move your fingers or toes often to avoid stiffness and to lessen swelling.  Raise (elevate) the injured area above the level of your heart while sitting or lying down. Safety  Do not use the injured limb to support your body weight until your health care provider says that it is okay.  Use crutches or other assistive devices as told by your health care provider. General instructions  Do not put pressure on any part of the cast or splint until it is fully hardened. This may take several hours.  Return to your normal activities as told by your health care provider. Ask your health care provider what activities are safe for you.  Take over-the-counter and prescription medicines only as told by your health care provider.  Keep all follow-up visits as told by your health care provider. This is important. Contact a health care provider if:  Your cast or splint gets  damaged.  The skin around the cast gets red or raw.  The skin under the cast is extremely itchy or painful.  Your cast or splint feels very uncomfortable.  Your cast or splint is too tight or too loose.  Your cast becomes wet or it develops a soft spot or area.  You get an object stuck under your cast. Get help right away if:  Your pain is getting worse.  The injured area tingles, becomes numb, or turns cold and blue.  The part of your body above or below the cast is swollen and discolored.  You cannot feel or move your fingers or toes.  There is fluid leaking through the cast.  You have severe pain or pressure under the cast.  You have trouble breathing.  You have shortness of breath.  You have chest  pain. This information is not intended to replace advice given to you by your health care provider. Make sure you discuss any questions you have with your health care provider. Document Released: 07/25/2000 Document Revised: 02/16/2016 Document Reviewed: 01/19/2016 Elsevier Interactive Patient Education  [employer]2018 Elsevier Inc.

## 2017-01-15 ENCOUNTER — Ambulatory Visit (INDEPENDENT_AMBULATORY_CARE_PROVIDER_SITE_OTHER): Payer: BLUE CROSS/BLUE SHIELD

## 2017-01-15 ENCOUNTER — Encounter: Payer: Self-pay | Admitting: Family Medicine

## 2017-01-15 ENCOUNTER — Ambulatory Visit (INDEPENDENT_AMBULATORY_CARE_PROVIDER_SITE_OTHER): Payer: BLUE CROSS/BLUE SHIELD | Admitting: Family Medicine

## 2017-01-15 ENCOUNTER — Telehealth: Payer: Self-pay | Admitting: Osteopathic Medicine

## 2017-01-15 VITALS — BP 125/84 | HR 94 | Wt 218.0 lb

## 2017-01-15 DIAGNOSIS — S52592A Other fractures of lower end of left radius, initial encounter for closed fracture: Secondary | ICD-10-CM | POA: Diagnosis not present

## 2017-01-15 DIAGNOSIS — S52502A Unspecified fracture of the lower end of left radius, initial encounter for closed fracture: Secondary | ICD-10-CM | POA: Insufficient documentation

## 2017-01-15 DIAGNOSIS — W19XXXD Unspecified fall, subsequent encounter: Secondary | ICD-10-CM

## 2017-01-15 DIAGNOSIS — M25532 Pain in left wrist: Secondary | ICD-10-CM

## 2017-01-15 DIAGNOSIS — S59202D Unspecified physeal fracture of lower end of radius, left arm, subsequent encounter for fracture with routine healing: Secondary | ICD-10-CM | POA: Diagnosis not present

## 2017-01-15 LAB — COMPLETE METABOLIC PANEL WITHOUT GFR
ALT: 55 U/L — ABNORMAL HIGH (ref 6–29)
AST: 28 U/L (ref 10–35)
Albumin: 3.2 g/dL — ABNORMAL LOW (ref 3.6–5.1)
Alkaline Phosphatase: 123 U/L — ABNORMAL HIGH (ref 33–115)
BUN: 9 mg/dL (ref 7–25)
CO2: 28 mmol/L (ref 20–31)
Calcium: 8.7 mg/dL (ref 8.6–10.2)
Chloride: 104 mmol/L (ref 98–110)
Creat: 0.67 mg/dL (ref 0.50–1.10)
GFR, Est African American: >89 mL/min
GFR, Est Non African American: >89 mL/min
Glucose, Bld: 125 mg/dL — ABNORMAL HIGH (ref 65–99)
Potassium: 4.6 mmol/L (ref 3.5–5.3)
Sodium: 137 mmol/L (ref 135–146)
Total Bilirubin: 0.3 mg/dL (ref 0.2–1.2)
Total Protein: 6.3 g/dL (ref 6.1–8.1)

## 2017-01-15 LAB — CBC WITH DIFFERENTIAL/PLATELET
BASOS ABS: 0 {cells}/uL (ref 0–200)
Basophils Relative: 0 %
EOS PCT: 1 %
Eosinophils Absolute: 85 cells/uL (ref 15–500)
HEMATOCRIT: 42 % (ref 35.0–45.0)
HEMOGLOBIN: 13.7 g/dL (ref 11.7–15.5)
LYMPHS ABS: 2465 {cells}/uL (ref 850–3900)
LYMPHS PCT: 29 %
MCH: 29.3 pg (ref 27.0–33.0)
MCHC: 32.6 g/dL (ref 32.0–36.0)
MCV: 89.7 fL (ref 80.0–100.0)
MONO ABS: 595 {cells}/uL (ref 200–950)
MPV: 8.6 fL (ref 7.5–12.5)
Monocytes Relative: 7 %
NEUTROS PCT: 63 %
Neutro Abs: 5355 cells/uL (ref 1500–7800)
Platelets: 312 10*3/uL (ref 140–400)
RBC: 4.68 MIL/uL (ref 3.80–5.10)
RDW: 15 % (ref 11.0–15.0)
WBC: 8.5 10*3/uL (ref 3.8–10.8)

## 2017-01-15 NOTE — Progress Notes (Signed)
Shari KyleJennifer Hancock is a 46 y.o. female who presents to [facility] today for follow-up left wrist fracture. Patient was seen May 31 for left wrist pain. This is found to be a nondisplaced distal radius fracture. She was treated with sugar tong splint. She returns to clinic today for recheck. She notes continued but improved pain in her left distal wrist. He she denies any radiating pain weakness or numbness.   Past Medical History:  Diagnosis Date  . Arthritis   . Diabetes mellitus without complication [facility](HCC)    Past Surgical History:  Procedure Laterality Date  . ABDOMINAL HYSTERECTOMY     Social History  Substance Use Topics  . Smoking status: Current Every Day Smoker    Packs/day: 1.00    Types: Cigarettes  . Smokeless tobacco: Never Used  . Alcohol use No     ROS:  As above   Medications: Current Outpatient Prescriptions  Medication Sig Dispense Refill  . ALPRAZolam (XANAX) 1 MG tablet Take 1 mg by mouth at bedtime as needed for anxiety.    Marland Kitchen. desvenlafaxine (PRISTIQ) 50 MG 24 hr tablet Take 50 mg by mouth daily.    Marland Kitchen. esomeprazole (NEXIUM) 40 MG capsule Take one tab PO daily, 30 minutes AC 15 capsule 1  . folic acid (FOLVITE) 1 MG tablet Take 1 tablet (1 mg total) by mouth daily. Except day you take methotrexate 30 tablet 3  . gabapentin (NEURONTIN) 300 MG capsule Take 2 capsules (600 mg total) by mouth 3 (three) times daily. 180 capsule 1  . metFORMIN (GLUCOPHAGE) 500 MG tablet Take 1 tablet (500 mg total) by mouth 2 (two) times daily with a meal. 30 tablet 1  . methotrexate (RHEUMATREX) 15 MG tablet Take 1 tablet (15 mg total) by mouth once a week. Caution: Chemotherapy. Protect from light. 4 tablet 0  . oxyCODONE-acetaminophen (PERCOCET) 10-325 MG tablet Take 1 tablet by mouth 3 (three) times daily.    . pregabalin (LYRICA) 75 MG capsule Take 1 capsule (75 mg total) by mouth 2 (two) times daily. 60 capsule 1   No current  facility-administered medications for this visit.    No Known Allergies   Exam:  BP 125/84   Pulse 94   Wt 218 lb (98.9 kg)   BMI 37.42 kg/m  General: Well Developed, well nourished, and in no acute distress.  Neuro/Psych: Alert and oriented x3, extra-ocular muscles intact, able to move all 4 extremities, sensation grossly intact. Skin: Warm and dry, no rashes noted.  Respiratory: Not using accessory muscles, speaking in full sentences, trachea midline.  Cardiovascular: Pulses palpable, no extremity edema. Abdomen: Does not appear distended. MSK: Left wrist some ecchymosis still present at the volar aspect of the wrist. Tender to palpation overlying the distal radius. Motion not tested. Pulses capillary refill and sensation are intact distally.  Patient was placed back into a long-arm cast   No results found for this or any previous visit (from the past 48 hour(s)). Dg Wrist Complete Left  Result Date: 01/15/2017 CLINICAL DATA:  Pain for 6 weeks EXAM: LEFT WRIST - COMPLETE 3+ VIEW COMPARISON:  Jan 08, 2017 FINDINGS: Frontal, oblique, lateral, and ulnar deviation scaphoid images were obtained. The previously noted transverse fracture of the distal radial metaphysis remains anatomic in alignment without change. There is no evident new fracture. No dislocation. Joint spaces appear normal. No erosive change. IMPRESSION: Stable transverse fracture of the distal radial metaphysis with alignment anatomic. No new fracture. No  dislocation. No evident arthropathy. These results will be called to the ordering clinician or representative by the Radiologist Assistant, and communication documented in the PACS or zVision Dashboard. Electronically Signed   By: Bretta Bang III M.D.   On: 01/15/2017 10:37      Assessment and Plan: 46 y.o. female with left distal radius fracture. Plan for long-arm cast today. Recheck in 2 weeks likely will transition to short arm cast at that time.    Orders  Placed This Encounter  Procedures  . DG Wrist Complete Left    Standing Status:   Future    Number of Occurrences:   1    Standing Expiration Date:   03/17/2018    Order Specific Question:   Reason for Exam (SYMPTOM  OR DIAGNOSIS REQUIRED)    Answer:   eval pain    Order Specific Question:   Is patient pregnant?    Answer:   No    Order Specific Question:   Preferred imaging location?    Answer:   [facility]    Order Specific Question:   Radiology Contrast Protocol - do NOT remove file path    Answer:   \\[email or web address]   No orders of the defined types were placed in this encounter.   Discussed warning signs or symptoms. Please see discharge instructions. Patient expresses understanding.

## 2017-01-15 NOTE — Patient Instructions (Signed)
Thank you for coming in today. Return for cast change in 2 weeks.  Return sooner if needed.  We will likely be switching to a removable washable short arm cast in 2 weeks.   Bring hydrocortisone cream with you and some nice soap.    Cast or Splint Care, Adult Casts and splints are supports that are worn to protect broken bones and other injuries. A cast or splint may hold a bone still and in the correct position while it heals. Casts and splints may also help to ease pain, swelling, and muscle spasms. How to care for your cast  Do not stick anything inside the cast to scratch your skin.  Check the skin around the cast every day. Tell your doctor about any concerns.  You may put lotion on dry skin around the edges of the cast. Do not put lotion on the skin under the cast.  Keep the cast clean.  If the cast is not waterproof: ? Do not let it get wet. ? Cover it with a watertight covering when you take a bath or a shower. How to care for your splint  Wear it as told by your doctor. Take it off only as told by your doctor.  Loosen the splint if your fingers or toes tingle, get numb, or turn cold and blue.  Keep the splint clean.  If the splint is not waterproof: ? Do not let it get wet. ? Cover it with a watertight covering when you take a bath or a shower. Follow these instructions at home: Bathing  Do not take baths or swim until your doctor says it is okay. Ask your doctor if you can take showers. You may only be allowed to take sponge baths for bathing.  If your cast or splint is not waterproof, cover it with a watertight covering when you take a bath or shower. Managing pain, stiffness, and swelling  Move your fingers or toes often to avoid stiffness and to lessen swelling.  Raise (elevate) the injured area above the level of your heart while sitting or lying down. Safety  Do not use the injured limb to support your body weight until your doctor says that it is  okay.  Use crutches or other assistive devices as told by your doctor. General instructions  Do not put pressure on any part of the cast or splint until it is fully hardened. This may take many hours.  Return to your normal activities as told by your doctor. Ask your doctor what activities are safe for you.  Keep all follow-up visits as told by your doctor. This is important. Contact a doctor if:  Your cast or splint gets damaged.  The skin around the cast gets red or raw.  The skin under the cast is very itchy or painful.  Your cast or splint feels very uncomfortable.  Your cast or splint is too tight or too loose.  Your cast becomes wet or it starts to have a soft spot or area.  You get an object stuck under your cast. Get help right away if:  Your pain gets worse.  The injured area tingles, gets numb, or turns blue and cold.  The part of your body above or below the cast is swollen and it turns a different color (is discolored).  You cannot feel or move your fingers or toes.  There is fluid leaking through the cast.  You have very bad pain or pressure under the cast.  You have trouble breathing.  You have shortness of breath.  You have chest pain. This information is not intended to replace advice given to you by your health care provider. Make sure you discuss any questions you have with your health care provider. Document Released: 11/27/2010 Document Revised: 07/18/2016 Document Reviewed: 07/18/2016 Elsevier Interactive Patient Education  2017 [employer].

## 2017-01-15 NOTE — Telephone Encounter (Signed)
SHE SHOULD NOT BE TAKING THAT MUCH. I don't know what bottle she is reading but the prescription written for one tablet once per week and only dispensed 4 tablets. Patient was supposed to get labs rechecked 2 weeks after her visit. I do not see that this was done. Orders are in place, she needs to go down to the lab, no refills until I can confirm that labs are normal, this is for medication safety purposes - does not have to be done fasting, she can come anytime lab is open. I just called and spoke to the patient personally, she doesn't have pill bottles with her at the moment, she agrees to go get labs done today and I scheduled her for a follow-up tomorrow, bring all pill bottles with her.  Last note 12/26/16 I printed instructions for patient: "Based on labs and symptoms, I suspect a possibility of Rheumatoid Arthritis. We are starting medicines for this and need to come back to lab in 2 weeks and follow up with me in the office in 4 weeks"

## 2017-01-15 NOTE — Telephone Encounter (Signed)
Patient stated she is out of her methotrexate and needs a refill. She said that on the bottle it states she is suppose to take 6 one day per week. She has an appointment with you on June 18th. Thanks

## 2017-01-16 ENCOUNTER — Ambulatory Visit (INDEPENDENT_AMBULATORY_CARE_PROVIDER_SITE_OTHER): Payer: BLUE CROSS/BLUE SHIELD | Admitting: Osteopathic Medicine

## 2017-01-16 ENCOUNTER — Encounter: Payer: Self-pay | Admitting: Osteopathic Medicine

## 2017-01-16 VITALS — BP 117/77 | HR 97 | Ht 64.0 in | Wt 219.0 lb

## 2017-01-16 DIAGNOSIS — M059 Rheumatoid arthritis with rheumatoid factor, unspecified: Secondary | ICD-10-CM | POA: Diagnosis not present

## 2017-01-16 MED ORDER — METHOTREXATE 2.5 MG PO TABS: 20.0000 mg | ORAL_TABLET | ORAL | 1 refills | Status: AC

## 2017-01-16 NOTE — Progress Notes (Signed)
HPI: Shari Hancock is a 46 y.o. female  who presents to [facility] today, 01/16/17,  for chief complaint of:  Chief Complaint  Patient presents with  . f/u RA    Follow-up rheumatoid arthritis: Patient states pain is a little bit better. Neck and knees are still bothering her but hands are doing better with the exception of broken left wrist. There was some confusion about the dosage of the medication she was taking, she brings in help bottle with methotrexate with instructions to take 6 tablets by mouth once a week, however it is at a lower milligram dose than what was called and so I think this is where the confusion came from. 2.5 mg tablets are the cheapest, I spoke personally with the pharmacy to confirm there was no error.   Patient also did not get lab work done 2 weeks after initiation of the medications as directed. One of the liver enzymes was slightly elevated but patient reports some alcohol consumption prior to the test.    Past medical history, surgical history, social history and family history reviewed.  Patient Active Problem List   Diagnosis Date Noted  . Distal radius fracture, left 01/15/2017  . 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 Diffuse pain 12/17/2016  . Arthralgia 12/17/2016  . Fibromyalgia 12/17/2016  . Morning stiffness of joints 12/17/2016  . Prediabetes 12/17/2016  . History of esophageal stricture 12/17/2016  . Pityriasis rosea 12/17/2016    Current medication list and allergy/intolerance information reviewed.   Current Outpatient Prescriptions on File Prior to Visit  Medication Sig Dispense Refill  . ALPRAZolam (XANAX) 1 MG tablet Take 1 mg by mouth at bedtime as needed for anxiety.    Marland Kitchen desvenlafaxine (PRISTIQ) 50 MG 24 hr tablet Take 50 mg by mouth daily.    Marland Kitchen esomeprazole (NEXIUM) 40 MG capsule Take one tab PO daily, 30 minutes AC 15 capsule 1  . folic acid (FOLVITE) 1 MG  tablet Take 1 tablet (1 mg total) by mouth daily. Except day you take methotrexate 30 tablet 3  . gabapentin (NEURONTIN) 300 MG capsule Take 2 capsules (600 mg total) by mouth 3 (three) times daily. 180 capsule 1  . metFORMIN (GLUCOPHAGE) 500 MG tablet Take 1 tablet (500 mg total) by mouth 2 (two) times daily with a meal. 30 tablet 1  . methotrexate (RHEUMATREX) 15 MG tablet Take 1 tablet (15 mg total) by mouth once a week. Caution: Chemotherapy. Protect from light. 4 tablet 0  . oxyCODONE-acetaminophen (PERCOCET) 10-325 MG tablet Take 1 tablet by mouth 3 (three) times daily.    . pregabalin (LYRICA) 75 MG capsule Take 1 capsule (75 mg total) by mouth 2 (two) times daily. 60 capsule 1   No current facility-administered medications on file prior to visit.    No Known Allergies    Review of Systems:  Constitutional: No recent illness  HEENT: No  headache, no vision change  Cardiac: No  chest pain, No  pressure  Respiratory:  No  shortness of breath. No  Cough  Musculoskeletal: No new myalgia/arthralgia  Skin: No  Rash  Neurologic: No  weakness, No  Dizziness   Exam:  BP 117/77   Pulse 97   Ht 5' 4" (1.626 m)   Wt 219 lb (99.3 kg)   BMI 37.59 kg/m   Constitutional: VS see above. General Appearance: alert, well-developed, well-nourished, NAD  Eyes: Normal lids and conjunctive, non-icteric sclera  Ears, Nose,  Mouth, Throat: MMM, Normal external inspection ears/nares/mouth/lips/gums.  Neck: No masses, trachea midline.   Respiratory: Normal respiratory effort. no wheeze, no rhonchi, no rales  Cardiovascular: S1/S2 normal, no murmur, no rub/gallop auscultated. RRR.   Musculoskeletal: Gait normal. Symmetric and independent movement of all extremities  Neurological: Normal balance/coordination. No tremor.  Skin: warm, dry, intact.   Psychiatric: Normal judgment/insight. Normal mood and affect. Oriented x3.    Recent Results (from the past 2160 hour(s))  COMPLETE  METABOLIC PANEL WITH GFR     Status: Abnormal   Collection Time: 12/11/16  1:29 PM  Result Value Ref Range   Sodium 144 135 - 146 mmol/L   Potassium 5.3 3.5 - 5.3 mmol/L   Chloride 105 98 - 110 mmol/L   CO2 29 20 - 31 mmol/L   Glucose, Bld 90 65 - 99 mg/dL   BUN 9 7 - 25 mg/dL   Creat 0.77 0.50 - 1.10 mg/dL   Total Bilirubin 0.2 0.2 - 1.2 mg/dL   Alkaline Phosphatase 80 33 - 115 U/L   AST 21 10 - 35 U/L   ALT 24 6 - 29 U/L   Total Protein 6.4 6.1 - 8.1 g/dL   Albumin 3.3 (L) 3.6 - 5.1 g/dL   Calcium 9.3 8.6 - 10.2 mg/dL   GFR, Est African American >89 >=60 mL/min   GFR, Est Non African American >89 >=60 mL/min  TSH     Status: None   Collection Time: 12/11/16  1:29 PM  Result Value Ref Range   TSH 2.77 mIU/L    Comment:   Reference Range   > or = 20 Years  0.40-4.50   Pregnancy Range First trimester  0.26-2.66 Second trimester 0.55-2.73 Third trimester  0.43-2.91     Hemoglobin A1c     Status: Abnormal   Collection Time: 12/11/16  1:29 PM  Result Value Ref Range   Hgb A1c MFr Bld 6.4 (H) <5.7 %    Comment:   For someone without known diabetes, a hemoglobin A1c value between 5.7% and 6.4% is consistent with prediabetes and should be confirmed with a follow-up test.   For someone with known diabetes, a value <7% indicates that their diabetes is well controlled. A1c targets should be individualized based on duration of diabetes, age, co-morbid conditions and other considerations.   This assay result is consistent with an increased risk of diabetes.   Currently, no consensus exists regarding use of hemoglobin A1c for diagnosis of diabetes in children.      Mean Plasma Glucose 137 mg/dL  POCT CBC w auto diff     Status: None   Collection Time: 12/11/16  1:46 PM  Result Value Ref Range   WBC  4.5 - 10.5 K/uL    Comment: see scanned report   Lymphocytes relative %  15 - 45 %   Monocytes relative %  2 - 10 %   Neutrophils relative % (GR)  44 - 76 %   Lymphocytes  absolute  0.1 - 1.8 K/uL   Monocyes absolute  0.1 - 1 K/uL   Neutrophils absolute (GR#)  1.7 - 7.8 K/uL   RBC  3.8 - 5.1 MIL/uL   Hemoglobin  11.8 - 15.5 g/dL   Hematocrit  34.8 - 46 %   MCV  78 - 100 fL   MCH  26 - 32 pg   MCHC  32 - 36.5 g/dL   RDW  11.6 - 14 %   Platelet count  140 -  400 K/uL   MPV  7.8 - 11 fL  POCT urinalysis dipstick     Status: None   Collection Time: 12/11/16  1:46 PM  Result Value Ref Range   Color, UA yellow yellow   Clarity, UA clear clear   Glucose, UA negative negative mg/dL   Bilirubin, UA negative negative   Ketones, POC UA negative negative mg/dL   Spec Grav, UA 1.025 1.010 - 1.025   Blood, UA negative negative   pH, UA 5.5 5.0 - 8.0   Protein Ur, POC negative negative mg/dL   Urobilinogen, UA 0.2 0.2 or 1.0 E.U./dL   Nitrite, UA Negative Negative   Leukocytes, UA Negative Negative  CBC with Differential/Platelet     Status: None   Collection Time: 12/17/16 10:59 AM  Result Value Ref Range   WBC 6.4 3.8 - 10.8 K/uL   RBC 4.94 3.80 - 5.10 MIL/uL   Hemoglobin 14.4 11.7 - 15.5 g/dL   HCT 43.5 35.0 - 45.0 %   MCV 88.1 80.0 - 100.0 fL   MCH 29.1 27.0 - 33.0 pg   MCHC 33.1 32.0 - 36.0 g/dL   RDW 14.2 11.0 - 15.0 %   Platelets 319 140 - 400 K/uL   MPV 9.2 7.5 - 12.5 fL   Neutro Abs 3,136 1,500 - 7,800 cells/uL   Lymphs Abs 2,752 850 - 3,900 cells/uL   Monocytes Absolute 384 200 - 950 cells/uL   Eosinophils Absolute 128 15 - 500 cells/uL   Basophils Absolute 0 0 - 200 cells/uL   Neutrophils Relative % 49 %   Lymphocytes Relative 43 %   Monocytes Relative 6 %   Eosinophils Relative 2 %   Basophils Relative 0 %   Smear Review Criteria for review not met   Lipid panel     Status: Abnormal   Collection Time: 12/17/16 10:59 AM  Result Value Ref Range   Cholesterol 256 (H) <200 mg/dL   Triglycerides 161 (H) <150 mg/dL   HDL 53 >50 mg/dL   Total CHOL/HDL Ratio 4.8 <5.0 Ratio   VLDL 32 (H) <30 mg/dL   LDL Cholesterol 171 (H) <100 mg/dL  HIV  antibody     Status: None   Collection Time: 12/17/16 10:59 AM  Result Value Ref Range   HIV 1&2 Ab, 4th Generation NONREACTIVE NONREACTIVE    Comment:   HIV-1 antigen and HIV-1/HIV-2 antibodies were not detected.  There is no laboratory evidence of HIV infection.   HIV-1/2 Antibody Diff        Not indicated. HIV-1 RNA, Qual TMA          Not indicated.     PLEASE NOTE: This information has been disclosed to you from records whose confidentiality may be protected by state law. If your state requires such protection, then the state law prohibits you from making any further disclosure of the information without the specific written consent of the person to whom it pertains, or as otherwise permitted by law. A general authorization for the release of medical or other information is NOT sufficient for this purpose.   The performance of this assay has not been clinically validated in patients less than 28 years old.   For additional information please refer to http://education.quest[facility]ostics.com/faq/FAQ106.  (This link is being provided for informational/educational purposes only.)     VITAMIN D 25 Hydroxy (Vit-D Deficiency, Fractures)     Status: None   Collection Time: 12/17/16 10:59 AM  Result Value Ref Range  Vit D, 25-Hydroxy 32 30 - 100 ng/mL    Comment: Vitamin D Status           25-OH Vitamin D        Deficiency                <20 ng/mL        Insufficiency         20 - 29 ng/mL        Optimal             > or = 30 ng/mL   For 25-OH Vitamin D testing on patients on D2-supplementation and patients for whom quantitation of D2 and D3 fractions is required, the [facility] 25-OH VIT D, (D2,D3), LC/MS/MS is recommended: order code [id number] (patients > 2 yrs).   CK     Status: None   Collection Time: 12/17/16 10:59 AM  Result Value Ref Range   Total CK 30 7 - 177 U/L  High sensitivity CRP     Status: Abnormal   Collection Time: 12/17/16 10:59 AM  Result Value Ref Range    CRP, High Sensitivity 5.5 (H) mg/L    Comment:                                Cardio CRP Cutpoints      For ages > 17 years:          CRP mg/L            Risk         < 1.0      Lower relative cardiovascular risk according to                    AHA/CDC guidelines     1.0 - 3.0      Average relative cardiovascular risk according                    to AHA/CDC guidelines   > 3.0 - 10.0     Higher relative cardiovascular risk according to                    AHA/CDC guidelines.  Consider retesting in 1 to 2                    weeks to exclude a benign transient elevation in                    the baseline CRP value secondary to infection or                    inflammation.         > 10.0     Persistent elevation, upon retesting, may be                    associated with infection and inflammation                    according to AHA/CDC guidelines.   Rheumatoid factor     Status: Abnormal   Collection Time: 12/17/16 10:59 AM  Result Value Ref Range   Rhuematoid fact SerPl-aCnc 36 (H) <14 IU/mL  Sedimentation rate     Status: None   Collection Time: 12/17/16 10:59 AM  Result Value Ref Range   Sed Rate 4 0 - 20 mm/hr  ANA  Status: None   Collection Time: 12/17/16 11:01 AM  Result Value Ref Range   Anit Nuclear Antibody(ANA) NEG NEGATIVE  CBC with Differential/Platelet     Status: None   Collection Time: 01/15/17 11:11 AM  Result Value Ref Range   WBC 8.5 3.8 - 10.8 K/uL   RBC 4.68 3.80 - 5.10 MIL/uL   Hemoglobin 13.7 11.7 - 15.5 g/dL   HCT 42.0 35.0 - 45.0 %   MCV 89.7 80.0 - 100.0 fL   MCH 29.3 27.0 - 33.0 pg   MCHC 32.6 32.0 - 36.0 g/dL   RDW 15.0 11.0 - 15.0 %   Platelets 312 140 - 400 K/uL   MPV 8.6 7.5 - 12.5 fL   Neutro Abs 5,355 1,500 - 7,800 cells/uL   Lymphs Abs 2,465 850 - 3,900 cells/uL   Monocytes Absolute 595 200 - 950 cells/uL   Eosinophils Absolute 85 15 - 500 cells/uL   Basophils Absolute 0 0 - 200 cells/uL   Neutrophils Relative % 63 %   Lymphocytes  Relative 29 %   Monocytes Relative 7 %   Eosinophils Relative 1 %   Basophils Relative 0 %   Smear Review Criteria for review not met   COMPLETE METABOLIC PANEL WITH GFR     Status: Abnormal   Collection Time: 01/15/17 11:11 AM  Result Value Ref Range   Sodium 137 135 - 146 mmol/L   Potassium 4.6 3.5 - 5.3 mmol/L   Chloride 104 98 - 110 mmol/L   CO2 28 20 - 31 mmol/L   Glucose, Bld 125 (H) 65 - 99 mg/dL   BUN 9 7 - 25 mg/dL   Creat 0.67 0.50 - 1.10 mg/dL   Total Bilirubin 0.3 0.2 - 1.2 mg/dL   Alkaline Phosphatase 123 (H) 33 - 115 U/L   AST 28 10 - 35 U/L   ALT 55 (H) 6 - 29 U/L   Total Protein 6.3 6.1 - 8.1 g/dL   Albumin 3.2 (L) 3.6 - 5.1 g/dL   Calcium 8.7 8.6 - 10.2 mg/dL   GFR, Est African American >89 >=60 mL/min   GFR, Est Non African American >89 >=60 mL/min        ASSESSMENT/PLAN: Given modest improvement in symptoms, will go up by 5 mg on a weekly dose of methotrexate. Medication list updated. Plan to repeat labs in 2 weeks, patient was even specific instructions to come to the lab for this so we can follow-up on slightly abnormal liver enzyme - if liver enzymes elevated despite avoidance of alcohol and Tylenol, we'll need to consider backing off on methotrexate  Rheumatoid arthritis with positive rheumatoid factor, involving unspecified site ([place]) - Plan: CBC, COMPLETE METABOLIC PANEL WITH GFR     Follow-up plan: Return in about 4 weeks (around 02/13/2017) for recheck with Dr Sheppard Coil, labs around 01/30/2017.  Visit summary with medication list and pertinent instructions was printed for patient to review, alert [place] if any changes needed. All questions at time of visit were answered - patient instructed to contact office with any additional concerns. ER/RTC precautions were reviewed with the patient and understanding verbalized.

## 2017-01-26 ENCOUNTER — Ambulatory Visit: Payer: BLUE CROSS/BLUE SHIELD | Admitting: Osteopathic Medicine

## 2017-01-29 ENCOUNTER — Ambulatory Visit (INDEPENDENT_AMBULATORY_CARE_PROVIDER_SITE_OTHER): Payer: BLUE CROSS/BLUE SHIELD

## 2017-01-29 ENCOUNTER — Encounter: Payer: Self-pay | Admitting: Family Medicine

## 2017-01-29 ENCOUNTER — Ambulatory Visit (INDEPENDENT_AMBULATORY_CARE_PROVIDER_SITE_OTHER): Payer: BLUE CROSS/BLUE SHIELD | Admitting: Family Medicine

## 2017-01-29 VITALS — BP 105/75 | HR 96 | Wt 215.0 lb

## 2017-01-29 DIAGNOSIS — S52592A Other fractures of lower end of left radius, initial encounter for closed fracture: Secondary | ICD-10-CM

## 2017-01-29 DIAGNOSIS — W19XXXD Unspecified fall, subsequent encounter: Secondary | ICD-10-CM | POA: Diagnosis not present

## 2017-01-29 DIAGNOSIS — S59202D Unspecified physeal fracture of lower end of radius, left arm, subsequent encounter for fracture with routine healing: Secondary | ICD-10-CM

## 2017-01-29 NOTE — Patient Instructions (Signed)
Thank you for coming in today. Recheck in 2 weeks.    Cast or Splint Care, Adult Casts and splints are supports that are worn to protect broken bones and other injuries. A cast or splint may hold a bone still and in the correct position while it heals. Casts and splints may also help ease pain, swelling, and muscle spasms. A cast is a hardened support that is usually made of fiberglass or plaster. It is custom-fit to the body and it offers more protection than a splint. It cannot be taken off and put back on. A splint is a type of soft support that is usually made from cloth and elastic. It can be adjusted or taken off as needed. You may need a cast or a splint if you:  Have a broken bone.  Have a soft-tissue injury.  Need to keep an injured body part from moving (keep it immobile) after surgery.  How is this treated? If you have a cast:  Do not stick anything inside the cast to scratch your skin. Sticking something in the cast increases your risk of infection.  Check the skin around the cast every day. Tell your health care provider about any concerns.  You may put lotion on dry skin around the edges of the cast. Do not put lotion on the skin underneath the cast.  Keep the cast clean.  If the cast is not waterproof: ? Do not let it get wet. ? Cover it with a watertight covering when you take a bath or a shower. If you have a splint:  Wear it as told by your health care provider. Remove it only as told by your health care provider.  Loosen the splint if your fingers or toes tingle, become numb, or turn cold and blue.  Keep the splint clean.  If the splint is not waterproof: ? Do not let it get wet. ? Cover it with a watertight covering when you take a bath or a shower. Bathing  Do not take baths or swim until your health care provider approves. Ask your health care provider if you can take showers. You may only be allowed to take sponge baths for bathing.  If your cast or  splint is not waterproof, cover it with a watertight covering when you take a bath or shower. Managing pain, stiffness, and swelling  Move your fingers or toes often to avoid stiffness and to lessen swelling.  Raise (elevate) the injured area above the level of your heart while sitting or lying down. Safety  Do not use the injured limb to support your body weight until your health care provider says that it is okay.  Use crutches or other assistive devices as told by your health care provider. General instructions  Do not put pressure on any part of the cast or splint until it is fully hardened. This may take several hours.  Return to your normal activities as told by your health care provider. Ask your health care provider what activities are safe for you.  Take over-the-counter and prescription medicines only as told by your health care provider.  Keep all follow-up visits as told by your health care provider. This is important. Contact a health care provider if:  Your cast or splint gets damaged.  The skin around the cast gets red or raw.  The skin under the cast is extremely itchy or painful.  Your cast or splint feels very uncomfortable.  Your cast or splint is too  tight or too loose.  Your cast becomes wet or it develops a soft spot or area.  You get an object stuck under your cast. Get help right away if:  Your pain is getting worse.  The injured area tingles, becomes numb, or turns cold and blue.  The part of your body above or below the cast is swollen and discolored.  You cannot feel or move your fingers or toes.  There is fluid leaking through the cast.  You have severe pain or pressure under the cast.  You have trouble breathing.  You have shortness of breath.  You have chest pain. This information is not intended to replace advice given to you by your health care provider. Make sure you discuss any questions you have with your health care  provider. Document Released: 07/25/2000 Document Revised: 02/16/2016 Document Reviewed: 01/19/2016 Elsevier Interactive Patient Education  [employer].

## 2017-01-29 NOTE — Progress Notes (Signed)
Shari Hancock is a 46 y.o. female who presents to [facility] today for a distal radius fracture. Patient was seen May 31 for distal radius fracture of her left wrist. She's been immobilized since then most recently in a long-arm cast since June 7. She feels well and denies any pain.   Past Medical History:  Diagnosis Date  . Arthritis   . Diabetes mellitus without complication [facility](HCC)    Past Surgical History:  Procedure Laterality Date  . ABDOMINAL HYSTERECTOMY     Social History  Substance Use Topics  . Smoking status: Current Every Day Smoker    Packs/day: 1.00    Types: Cigarettes  . Smokeless tobacco: Never Used  . Alcohol use No     ROS:  As above   Medications: Current Outpatient Prescriptions  Medication Sig Dispense Refill  . ALPRAZolam (XANAX) 1 MG tablet Take 1 mg by mouth at bedtime as needed for anxiety.    Marland Kitchen. desvenlafaxine (PRISTIQ) 50 MG 24 hr tablet Take 50 mg by mouth daily.    Marland Kitchen. esomeprazole (NEXIUM) 40 MG capsule Take one tab PO daily, 30 minutes AC 15 capsule 1  . folic acid (FOLVITE) 1 MG tablet Take 1 tablet (1 mg total) by mouth daily. Except day you take methotrexate 30 tablet 3  . gabapentin (NEURONTIN) 300 MG capsule Take 2 capsules (600 mg total) by mouth 3 (three) times daily. 180 capsule 1  . metFORMIN (GLUCOPHAGE) 500 MG tablet Take 1 tablet (500 mg total) by mouth 2 (two) times daily with a meal. 30 tablet 1  . methotrexate (RHEUMATREX) 2.5 MG tablet Take 8 tablets (20 mg total) by mouth once a week. Caution: Chemotherapy. Protect from light. 32 tablet 1  . oxyCODONE-acetaminophen (PERCOCET) 10-325 MG tablet Take 1 tablet by mouth 3 (three) times daily.    . pregabalin (LYRICA) 75 MG capsule Take 1 capsule (75 mg total) by mouth 2 (two) times daily. 60 capsule 1   No current facility-administered medications for this visit.    No Known Allergies   Exam:  BP 105/75   Pulse 96   Wt 215 lb  (97.5 kg)   BMI 36.90 kg/m  General: Well Developed, well nourished, and in no acute distress.  Neuro/Psych: Alert and oriented x3, extra-ocular muscles intact, able to move all 4 extremities, sensation grossly intact. Skin: Warm and dry, no rashes noted.  Respiratory: Not using accessory muscles, speaking in full sentences, trachea midline.  Cardiovascular: Pulses palpable, no extremity edema. Abdomen: Does not appear distended. MSK: Left wrist normal-appearing nontender. Pulses capillary refill and sensation intact distally.    No results found for this or any previous visit (from the past 48 hour(s)). Dg Wrist Complete Left  Result Date: 01/29/2017 CLINICAL DATA:  Follow-up of closed distal left radial fracture. EXAM: LEFT WRIST - COMPLETE 3+ VIEW COMPARISON:  Left wrist series of Jan 08, 2017 and January 15, 2017. FINDINGS: The bones are subjectively adequately mineralized. The transversely oriented fracture line through the distal radial metaphysis remains visible. There is no significant periosteal reaction but there has been some bony resorption around the fracture site. The adjacent ulna is intact as are the carpal bones. IMPRESSION: Persistently visible transversely oriented nondisplaced distal left radial metaphyseal fracture without significant bony bridging. Electronically Signed   By: David  [place] M.D.   On: 01/29/2017 11:10      Assessment and Plan: 46 y.o. female with clinically healing distal radius fracture. Transition to  a short arm access cast. Recheck in 2 weeks.    Orders Placed This Encounter  Procedures  . DG Wrist Complete Left    Standing Status:   Future    Number of Occurrences:   1    Standing Expiration Date:   03/31/2018    Order Specific Question:   Reason for Exam (SYMPTOM  OR DIAGNOSIS REQUIRED)    Answer:   eval fx    Order Specific Question:   Is patient pregnant?    Answer:   No    Order Specific Question:   Preferred imaging location?    Answer:    [facility]    Order Specific Question:   Radiology Contrast Protocol - do NOT remove file path    Answer:   [email or web address]   No orders of the defined types were placed in this encounter.   Discussed warning signs or symptoms. Please see discharge instructions. Patient expresses understanding.

## 2017-02-10 ENCOUNTER — Telehealth: Payer: Self-pay | Admitting: Family Medicine

## 2017-02-10 NOTE — Telephone Encounter (Signed)
-----   Message from Rodolph BongEvan S Gaylene Moylan, MD sent at 02/05/2017  7:55 AM EDT ----- Please have pt make an appt if she is having problems.   Clayburn PertEvan ----- Message ----- From: Thom ChimesHenry, [place] M, CMA Sent: 01/30/2017   4:50 PM To: Rodolph BongEvan S Page Pucciarelli, MD  Pt notified of results.  Pt stated that she is having issues with her cast.  I offered to make her an appointment for Monday.  She is refusing the appointment if she will get charged for it. -EH/RMA

## 2017-02-10 NOTE — Telephone Encounter (Signed)
I called and left a message about the cast.  I asked her to schedule with me soon.

## 2017-02-16 ENCOUNTER — Ambulatory Visit: Payer: BLUE CROSS/BLUE SHIELD | Admitting: Osteopathic Medicine

## 2017-02-22 ENCOUNTER — Other Ambulatory Visit: Payer: Self-pay | Admitting: Osteopathic Medicine

## 2017-02-22 DIAGNOSIS — M797 Fibromyalgia: Secondary | ICD-10-CM

## 2017-02-26 ENCOUNTER — Ambulatory Visit: Payer: BLUE CROSS/BLUE SHIELD | Admitting: Osteopathic Medicine

## 2017-02-28 LAB — COMPLETE METABOLIC PANEL WITH GFR
ALBUMIN: 3.3 g/dL — AB (ref 3.6–5.1)
ALK PHOS: 86 U/L (ref 33–115)
ALT: 54 U/L — ABNORMAL HIGH (ref 6–29)
AST: 38 U/L — AB (ref 10–35)
BUN: 10 mg/dL (ref 7–25)
CALCIUM: 8.7 mg/dL (ref 8.6–10.2)
CHLORIDE: 108 mmol/L (ref 98–110)
CO2: 25 mmol/L (ref 20–31)
CREATININE: 0.81 mg/dL (ref 0.50–1.10)
GFR, Est Non African American: 86 mL/min (ref >=60)
Glucose, Bld: 107 mg/dL — ABNORMAL HIGH (ref 65–99)
Potassium: 5 mmol/L (ref 3.5–5.3)
Sodium: 143 mmol/L (ref 135–146)
Total Bilirubin: 0.2 mg/dL (ref 0.2–1.2)
Total Protein: 6.1 g/dL (ref 6.1–8.1)

## 2017-02-28 LAB — CBC
HEMATOCRIT: 41.2 % (ref 35.0–45.0)
HEMOGLOBIN: 13.3 g/dL (ref 11.7–15.5)
MCH: 29.4 pg (ref 27.0–33.0)
MCHC: 32.3 g/dL (ref 32.0–36.0)
MCV: 90.9 fL (ref 80.0–100.0)
MPV: 9.2 fL (ref 7.5–12.5)
Platelets: 268 10*3/uL (ref 140–400)
RBC: 4.53 MIL/uL (ref 3.80–5.10)
RDW: 15.9 % — AB (ref 11.0–15.0)
WBC: 6.1 10*3/uL (ref 3.8–10.8)

## 2017-03-02 ENCOUNTER — Ambulatory Visit: Payer: BLUE CROSS/BLUE SHIELD | Admitting: Osteopathic Medicine

## 2017-03-03 ENCOUNTER — Ambulatory Visit (INDEPENDENT_AMBULATORY_CARE_PROVIDER_SITE_OTHER): Payer: BLUE CROSS/BLUE SHIELD

## 2017-03-03 ENCOUNTER — Ambulatory Visit (INDEPENDENT_AMBULATORY_CARE_PROVIDER_SITE_OTHER): Payer: BLUE CROSS/BLUE SHIELD | Admitting: Osteopathic Medicine

## 2017-03-03 ENCOUNTER — Encounter: Payer: Self-pay | Admitting: Osteopathic Medicine

## 2017-03-03 VITALS — BP 110/74 | HR 96 | Ht 64.0 in | Wt 213.0 lb

## 2017-03-03 DIAGNOSIS — R05 Cough: Secondary | ICD-10-CM

## 2017-03-03 DIAGNOSIS — M059 Rheumatoid arthritis with rheumatoid factor, unspecified: Secondary | ICD-10-CM

## 2017-03-03 DIAGNOSIS — R059 Cough, unspecified: Secondary | ICD-10-CM

## 2017-03-03 DIAGNOSIS — G4700 Insomnia, unspecified: Secondary | ICD-10-CM

## 2017-03-03 DIAGNOSIS — F17200 Nicotine dependence, unspecified, uncomplicated: Secondary | ICD-10-CM | POA: Diagnosis not present

## 2017-03-03 MED ORDER — ALBUTEROL SULFATE HFA 108 (90 BASE) MCG/ACT IN AERS: 2.0000 | INHALATION_SPRAY | RESPIRATORY_TRACT | 5 refills | Status: AC | PRN

## 2017-03-03 MED ORDER — GUAIFENESIN-CODEINE 100-10 MG/5ML PO SYRP: 5.0000 mL | ORAL_SOLUTION | Freq: Two times a day (BID) | ORAL | 0 refills | Status: AC | PRN

## 2017-03-03 MED ORDER — AZITHROMYCIN 250 MG PO TABS: ORAL_TABLET | ORAL | 0 refills | Status: AC

## 2017-03-03 NOTE — Progress Notes (Signed)
HPI: Shari Hancock is a 46 y.o. female  who presents to [facility] today, 03/03/17,  for chief complaint of:  Chief Complaint  Patient presents with  . Rheumatoid Arthritis  . 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 Overall, patient states her hand pain is a bit better. We are on fairly low dose methotrexate at this point. She has not been getting labs done consistently on schedule. About a month ago one of her liver enzymes were slightly elevated, she stated at that time that she had been drinking the night before prior to the blood draw so we repeated today and liver enzymes are are not improved in one is in fact worse. See below for lab results reviewed.  Acute concern: Patient is also complaining of persistent cough for the past few weeks, no fever. She is still a smoker, no upper respiratory symptoms other than some sinus congestion. Has not tried any over-the-counter medications for this.   Past medical history, surgical history, social history and family history reviewed.  Patient Active Problem List   Diagnosis Date Noted  . Distal radius fracture, left 01/15/2017  . Rheumatoid arthritis with positive rheumatoid factor ([place]) 12/27/2016  . Diffuse pain 12/17/2016  . Arthralgia 12/17/2016  . Fibromyalgia 12/17/2016  . Morning stiffness of joints 12/17/2016  . Prediabetes 12/17/2016  . History of esophageal stricture 12/17/2016  . Pityriasis rosea 12/17/2016    Current medication list and allergy/intolerance information reviewed.   Current Outpatient Prescriptions on File Prior to Visit  Medication Sig Dispense Refill  . ALPRAZolam (XANAX) 1 MG tablet Take 1 mg by mouth at bedtime as needed for anxiety.     Marland Kitchen desvenlafaxine (PRISTIQ) 50 MG 24 hr tablet Take 50 mg by mouth daily.    Marland Kitchen esomeprazole (NEXIUM) 40 MG capsule Take one tab PO daily, 30 minutes AC 15 capsule 1  . folic acid (FOLVITE) 1 MG tablet Take 1 tablet (1 mg total) by mouth daily. Except day you take methotrexate 30 tablet 3  . gabapentin (NEURONTIN) 300 MG capsule TAKE 2 CAPSULES BY MOUTH 3 TIMES DAILY 180 capsule 1  . metFORMIN (GLUCOPHAGE) 500 MG tablet Take 1 tablet (500 mg total) by mouth 2 (two) times daily with a meal. 30 tablet 1  . methotrexate (RHEUMATREX) 2.5 MG tablet Take 8 tablets (20 mg total) by mouth once a week. Caution: Chemotherapy. Protect from light. 32 tablet 1  . oxyCODONE-acetaminophen (PERCOCET) 10-325 MG tablet Take 1 tablet by mouth 3 (three) times daily.    . pregabalin (LYRICA) 75 MG capsule Take 1 capsule (75 mg total) by mouth 2 (two) times daily. 60 capsule 1   No current facility-administered medications on file prior to visit.    No Known Allergies    Review of Systems:  Constitutional: +recent resp illness  HEENT: No  headache, no vision change  Cardiac: No  chest pain, No  pressure, No palpitations  Respiratory:  No  shortness of breath. +Cough  Gastrointestinal: No  abdominal pain, no change on bowel habits  Musculoskeletal: No new myalgia/arthralgia  Neurologic: No  weakness, No  Dizziness   Exam:  BP 110/74   Pulse 96   Ht _0  (1.626 m)   Wt 213 lb (96.6 kg)   BMI  36.56 kg/m   Constitutional: VS see above. General Appearance: alert, well-developed, well-nourished, NAD  Eyes: Normal lids and conjunctive, non-icteric sclera  Ears, Nose, Mouth, Throat: MMM, Normal external inspection ears/nares/mouth/lips/gums.  Neck: No masses, trachea midline.   Respiratory: Normal respiratory effort. no wheeze, no rhonchi, no rales  Cardiovascular: S1/S2 normal, no murmur, no rub/gallop auscultated. RRR.   Musculoskeletal: Gait normal. Symmetric and independent movement of all  extremities  Neurological: Normal balance/coordination. No tremor.  Skin: warm, dry, intact.   Psychiatric: Normal judgment/insight. Normal mood and affect. Oriented x3.    Recent Results (from the past 2160 hour(s))  COMPLETE METABOLIC PANEL WITH GFR     Status: Abnormal   Collection Time: 12/11/16  1:29 PM  Result Value Ref Range   Sodium 144 135 - 146 mmol/L   Potassium 5.3 3.5 - 5.3 mmol/L   Chloride 105 98 - 110 mmol/L   CO2 29 20 - 31 mmol/L   Glucose, Bld 90 65 - 99 mg/dL   BUN 9 7 - 25 mg/dL   Creat 0.77 0.50 - 1.10 mg/dL   Total Bilirubin 0.2 0.2 - 1.2 mg/dL   Alkaline Phosphatase 80 33 - 115 U/L   AST 21 10 - 35 U/L   ALT 24 6 - 29 U/L   Total Protein 6.4 6.1 - 8.1 g/dL   Albumin 3.3 (L) 3.6 - 5.1 g/dL   Calcium 9.3 8.6 - 10.2 mg/dL   GFR, Est African American >89 >=60 mL/min   GFR, Est Non African American >89 >=60 mL/min  TSH     Status: None   Collection Time: 12/11/16  1:29 PM  Result Value Ref Range   TSH 2.77 mIU/L    Comment:   Reference Range   > or = 20 Years  0.40-4.50   Pregnancy Range First trimester  0.26-2.66 Second trimester 0.55-2.73 Third trimester  0.43-2.91     Hemoglobin A1c     Status: Abnormal   Collection Time: 12/11/16  1:29 PM  Result Value Ref Range   Hgb A1c MFr Bld 6.4 (H) <5.7 %    Comment:   For someone without known diabetes, a hemoglobin A1c value between 5.7% and 6.4% is consistent with prediabetes and should be confirmed with a follow-up test.   For someone with known diabetes, a value <7% indicates that their diabetes is well controlled. A1c targets should be individualized based on duration of diabetes, age, co-morbid conditions and other considerations.   This assay result is consistent with an increased risk of diabetes.   Currently, no consensus exists regarding use of hemoglobin A1c for diagnosis of diabetes in children.      Mean Plasma Glucose 137 mg/dL  POCT CBC w auto diff     Status: None    Collection Time: 12/11/16  1:46 PM  Result Value Ref Range   WBC  4.5 - 10.5 K/uL    Comment: see scanned report   Lymphocytes relative %  15 - 45 %   Monocytes relative %  2 - 10 %   Neutrophils relative % (GR)  44 - 76 %   Lymphocytes absolute  0.1 - 1.8 K/uL   Monocyes absolute  0.1 - 1 K/uL   Neutrophils absolute (GR#)  1.7 - 7.8 K/uL   RBC  3.8 - 5.1 MIL/uL   Hemoglobin  11.8 - 15.5 g/dL   Hematocrit  34.8 - 46 %   MCV  78 - 100 fL   MCH  26 -  32 pg   MCHC  32 - 36.5 g/dL   RDW  11.6 - 14 %   Platelet count  140 - 400 K/uL   MPV  7.8 - 11 fL  POCT urinalysis dipstick     Status: None   Collection Time: 12/11/16  1:46 PM  Result Value Ref Range   Color, UA yellow yellow   Clarity, UA clear clear   Glucose, UA negative negative mg/dL   Bilirubin, UA negative negative   Ketones, POC UA negative negative mg/dL   Spec Grav, UA 1.025 1.010 - 1.025   Blood, UA negative negative   pH, UA 5.5 5.0 - 8.0   Protein Ur, POC negative negative mg/dL   Urobilinogen, UA 0.2 0.2 or 1.0 E.U./dL   Nitrite, UA Negative Negative   Leukocytes, UA Negative Negative  CBC with Differential/Platelet     Status: None   Collection Time: 12/17/16 10:59 AM  Result Value Ref Range   WBC 6.4 3.8 - 10.8 K/uL   RBC 4.94 3.80 - 5.10 MIL/uL   Hemoglobin 14.4 11.7 - 15.5 g/dL   HCT 43.5 35.0 - 45.0 %   MCV 88.1 80.0 - 100.0 fL   MCH 29.1 27.0 - 33.0 pg   MCHC 33.1 32.0 - 36.0 g/dL   RDW 14.2 11.0 - 15.0 %   Platelets 319 140 - 400 K/uL   MPV 9.2 7.5 - 12.5 fL   Neutro Abs 3,136 1,500 - 7,800 cells/uL   Lymphs Abs 2,752 850 - 3,900 cells/uL   Monocytes Absolute 384 200 - 950 cells/uL   Eosinophils Absolute 128 15 - 500 cells/uL   Basophils Absolute 0 0 - 200 cells/uL   Neutrophils Relative % 49 %   Lymphocytes Relative 43 %   Monocytes Relative 6 %   Eosinophils Relative 2 %   Basophils Relative 0 %   Smear Review Criteria for review not met   Lipid panel     Status: Abnormal   Collection  Time: 12/17/16 10:59 AM  Result Value Ref Range   Cholesterol 256 (H) <200 mg/dL   Triglycerides 161 (H) <150 mg/dL   HDL 53 >50 mg/dL   Total CHOL/HDL Ratio 4.8 <5.0 Ratio   VLDL 32 (H) <30 mg/dL   LDL Cholesterol 171 (H) <100 mg/dL  HIV antibody     Status: None   Collection Time: 12/17/16 10:59 AM  Result Value Ref Range   HIV 1&2 Ab, 4th Generation NONREACTIVE NONREACTIVE    Comment:   HIV-1 antigen and HIV-1/HIV-2 antibodies were not detected.  There is no laboratory evidence of HIV infection.   HIV-1/2 Antibody Diff        Not indicated. HIV-1 RNA, Qual TMA          Not indicated.     PLEASE NOTE: This information has been disclosed to you from records whose confidentiality may be protected by state law. If your state requires such protection, then the state law prohibits you from making any further disclosure of the information without the specific written consent of the person to whom it pertains, or as otherwise permitted by law. A general authorization for the release of medical or other information is NOT sufficient for this purpose.   The performance of this assay has not been clinically validated in patients less than 48 years old.   For additional information please refer to http://education.[facility].com/faq/FAQ106.  (This link is being provided for informational/educational purposes only.)     VITAMIN D  25 Hydroxy (Vit-D Deficiency, Fractures)     Status: None   Collection Time: 12/17/16 10:59 AM  Result Value Ref Range   Vit D, 25-Hydroxy 32 30 - 100 ng/mL    Comment: Vitamin D Status           25-OH Vitamin D        Deficiency                <20 ng/mL        Insufficiency         20 - 29 ng/mL        Optimal             > or = 30 ng/mL   For 25-OH Vitamin D testing on patients on D2-supplementation and patients for whom quantitation of D2 and D3 fractions is required, the [facility] 25-OH VIT D, (D2,D3), LC/MS/MS is recommended: order  code [phone] (patients > 2 yrs).   CK     Status: None   Collection Time: 12/17/16 10:59 AM  Result Value Ref Range   Total CK 30 7 - 177 U/L  High sensitivity CRP     Status: Abnormal   Collection Time: 12/17/16 10:59 AM  Result Value Ref Range   CRP, High Sensitivity 5.5 (H) mg/L    Comment:                                Cardio CRP Cutpoints      For ages > 17 years:          CRP mg/L            Risk         < 1.0      Lower relative cardiovascular risk according to                    AHA/CDC guidelines     1.0 - 3.0      Average relative cardiovascular risk according                    to AHA/CDC guidelines   > 3.0 - 10.0     Higher relative cardiovascular risk according to                    AHA/CDC guidelines.  Consider retesting in 1 to 2                    weeks to exclude a benign transient elevation in                    the baseline CRP value secondary to infection or                    inflammation.         > 10.0     Persistent elevation, upon retesting, may be                    associated with infection and inflammation                    according to AHA/CDC guidelines.   Rheumatoid factor     Status: Abnormal   Collection Time: 12/17/16 10:59 AM  Result Value Ref Range   Rhuematoid fact SerPl-aCnc 36 (H) <14 IU/mL  Sedimentation rate     Status: None  Collection Time: 12/17/16 10:59 AM  Result Value Ref Range   Sed Rate 4 0 - 20 mm/hr  ANA     Status: None   Collection Time: 12/17/16 11:01 AM  Result Value Ref Range   Anit Nuclear Antibody(ANA) NEG NEGATIVE  CBC with Differential/Platelet     Status: None   Collection Time: 01/15/17 11:11 AM  Result Value Ref Range   WBC 8.5 3.8 - 10.8 K/uL   RBC 4.68 3.80 - 5.10 MIL/uL   Hemoglobin 13.7 11.7 - 15.5 g/dL   HCT 42.0 35.0 - 45.0 %   MCV 89.7 80.0 - 100.0 fL   MCH 29.3 27.0 - 33.0 pg   MCHC 32.6 32.0 - 36.0 g/dL   RDW 15.0 11.0 - 15.0 %   Platelets 312 140 - 400 K/uL   MPV 8.6 7.5 - 12.5 fL   Neutro  Abs 5,355 1,500 - 7,800 cells/uL   Lymphs Abs 2,465 850 - 3,900 cells/uL   Monocytes Absolute 595 200 - 950 cells/uL   Eosinophils Absolute 85 15 - 500 cells/uL   Basophils Absolute 0 0 - 200 cells/uL   Neutrophils Relative % 63 %   Lymphocytes Relative 29 %   Monocytes Relative 7 %   Eosinophils Relative 1 %   Basophils Relative 0 %   Smear Review Criteria for review not met   COMPLETE METABOLIC PANEL WITH GFR     Status: Abnormal   Collection Time: 01/15/17 11:11 AM  Result Value Ref Range   Sodium 137 135 - 146 mmol/L   Potassium 4.6 3.5 - 5.3 mmol/L   Chloride 104 98 - 110 mmol/L   CO2 28 20 - 31 mmol/L   Glucose, Bld 125 (H) 65 - 99 mg/dL   BUN 9 7 - 25 mg/dL   Creat 0.67 0.50 - 1.10 mg/dL   Total Bilirubin 0.3 0.2 - 1.2 mg/dL   Alkaline Phosphatase 123 (H) 33 - 115 U/L   AST 28 10 - 35 U/L   ALT 55 (H) 6 - 29 U/L   Total Protein 6.3 6.1 - 8.1 g/dL   Albumin 3.2 (L) 3.6 - 5.1 g/dL   Calcium 8.7 8.6 - 10.2 mg/dL   GFR, Est African American >89 >=60 mL/min   GFR, Est Non African American >89 >=60 mL/min  CBC     Status: Abnormal   Collection Time: 02/27/17  1:55 PM  Result Value Ref Range   WBC 6.1 3.8 - 10.8 K/uL   RBC 4.53 3.80 - 5.10 MIL/uL   Hemoglobin 13.3 11.7 - 15.5 g/dL   HCT 41.2 35.0 - 45.0 %   MCV 90.9 80.0 - 100.0 fL   MCH 29.4 27.0 - 33.0 pg   MCHC 32.3 32.0 - 36.0 g/dL   RDW 15.9 (H) 11.0 - 15.0 %   Platelets 268 140 - 400 K/uL   MPV 9.2 7.5 - 12.5 fL  COMPLETE METABOLIC PANEL WITH GFR     Status: Abnormal   Collection Time: 02/27/17  1:55 PM  Result Value Ref Range   Sodium 143 135 - 146 mmol/L   Potassium 5.0 3.5 - 5.3 mmol/L   Chloride 108 98 - 110 mmol/L   CO2 25 20 - 31 mmol/L   Glucose, Bld 107 (H) 65 - 99 mg/dL   BUN 10 7 - 25 mg/dL   Creat 0.81 0.50 - 1.10 mg/dL   Total Bilirubin 0.2 0.2 - 1.2 mg/dL   Alkaline Phosphatase 86 33 - 115  U/L   AST 38 (H) 10 - 35 U/L   ALT 54 (H) 6 - 29 U/L   Total Protein 6.1 6.1 - 8.1 g/dL   Albumin 3.3  (L) 3.6 - 5.1 g/dL   Calcium 8.7 8.6 - 10.2 mg/dL   GFR, Est African American >89 >=60 mL/min   GFR, Est Non African American 86 >=60 mL/min   Chest x-ray personally reviewed, consistent with bronchitis, I don't see any mass, effusion, pneumonia.  Dg Chest 2 View  Result Date: 03/03/2017 CLINICAL DATA:  Three-week history of cough.  Current smoker. EXAM: CHEST  2 VIEW COMPARISON:  None. FINDINGS: Cardiomediastinal silhouette unremarkable. Mildly prominent bronchovascular markings diffusely and mild central peribronchial thickening. Lungs otherwise clear. No localized airspace consolidation. No pleural effusions. No pneumothorax. Normal pulmonary vascularity. Degenerative changes involving the upper lumbar spine. IMPRESSION: Mild changes of bronchitis and/or asthma without focal pneumonia. Electronically Signed   By: Evangeline Dakin M.D.   On: 03/03/2017 11:19      ASSESSMENT/PLAN:   At this point given concern for possible lab abnormalities due to methotrexate, and the fact that she is getting minimal relief, I am reluctant to increase dose or even continue the medication without further consultation with rheumatology. Referral was placed. Patient advised that we will need to monitor labs every 2 weeks while she is still taking the medication, if liver enzymes continue to increase we should stop the methotrexate but am okay to continue for now with close monitoring.  Chest x-ray shows no concern for pneumonia, we'll treat as bronchitis, patient encouraged to quit smoking. Cautioned to not take her pain medications and Xanax will she is taking the cough syrup. She is getting these from her pain management specialist. Controlled substance database was reviewed and no concerns.  Patient brought up insomnia at the end of the visit, was instructed to schedule separate visit to discuss this further, home instructions were given for behavioral modifications for insomnia  Rheumatoid arthritis with  positive rheumatoid factor, involving unspecified site [facility]) - Plan: Ambulatory referral to Rheumatology  Cough - Likely viral infectious bronchitis or postviral cough syndrome complicated by tobacco abuse - Plan: DG Chest 2 View, guaiFENesin-codeine (ROBITUSSIN AC) 100-10 MG/5ML syrup  Insomnia, unspecified type - We'll discuss in detail next visit      Follow-up plan: Return in about 2 weeks (around 03/17/2017) for discuss insomnia, recheck labs .  Visit summary with medication list and pertinent instructions was printed for patient to review, alert [place] if any changes needed. All questions at time of visit were answered - patient instructed to contact office with any additional concerns. ER/RTC precautions were reviewed with the patient and understanding verbalized.

## 2017-03-17 ENCOUNTER — Ambulatory Visit: Payer: BLUE CROSS/BLUE SHIELD | Admitting: Osteopathic Medicine

## 2019-05-08 IMAGING — DX DG WRIST COMPLETE 3+V*L*
4 series · 4 of 4 positions shown · non-contrast
Comparison: January 08, 2017

CLINICAL DATA: Pain for 6 weeks

EXAM:
LEFT WRIST - COMPLETE 3+ VIEW

[wrist pa]
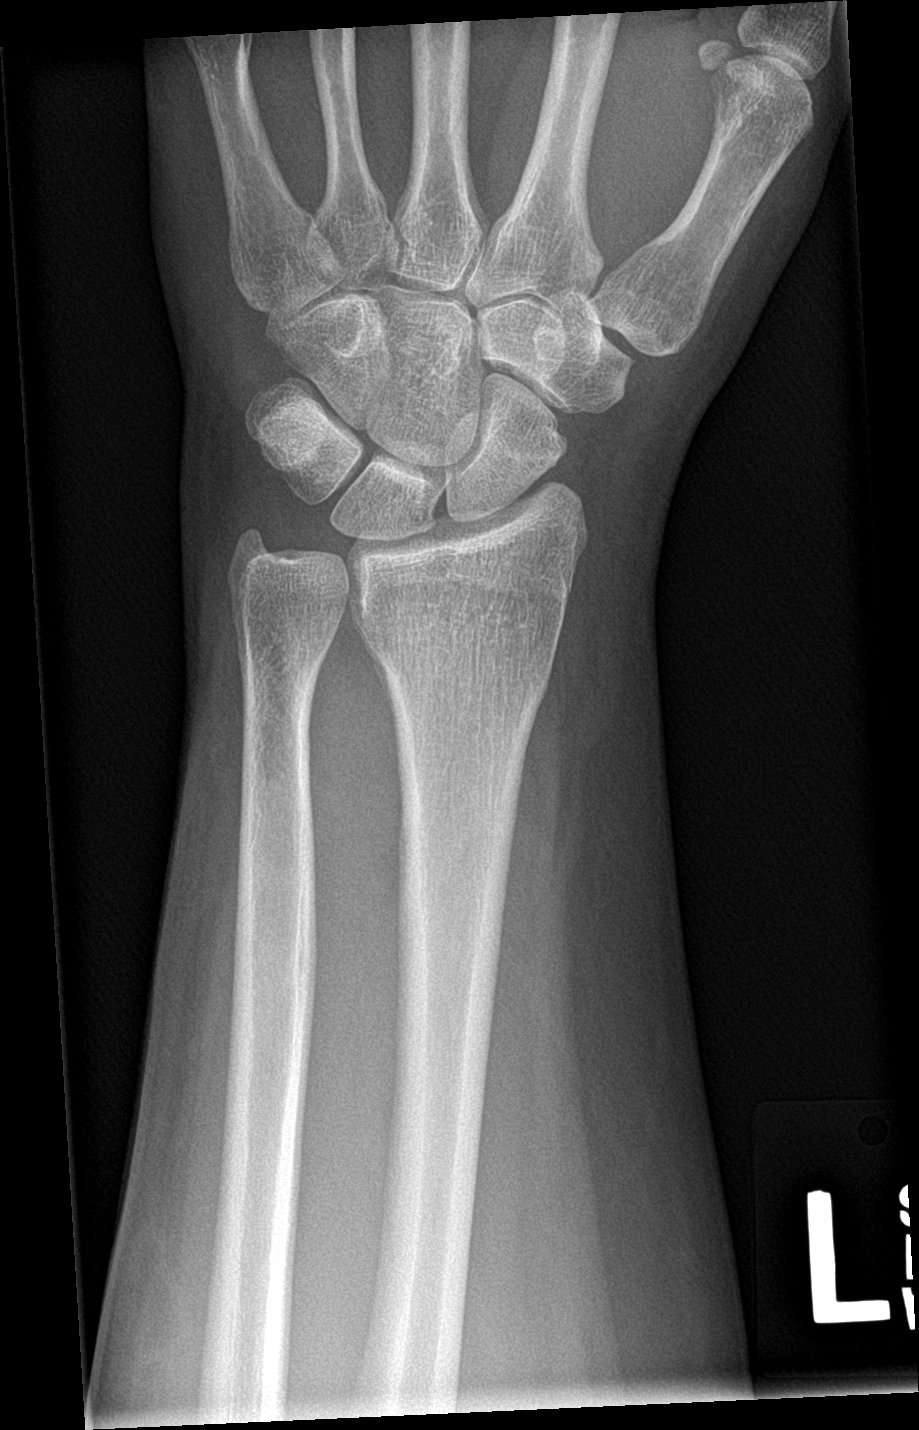

[wrist obl]
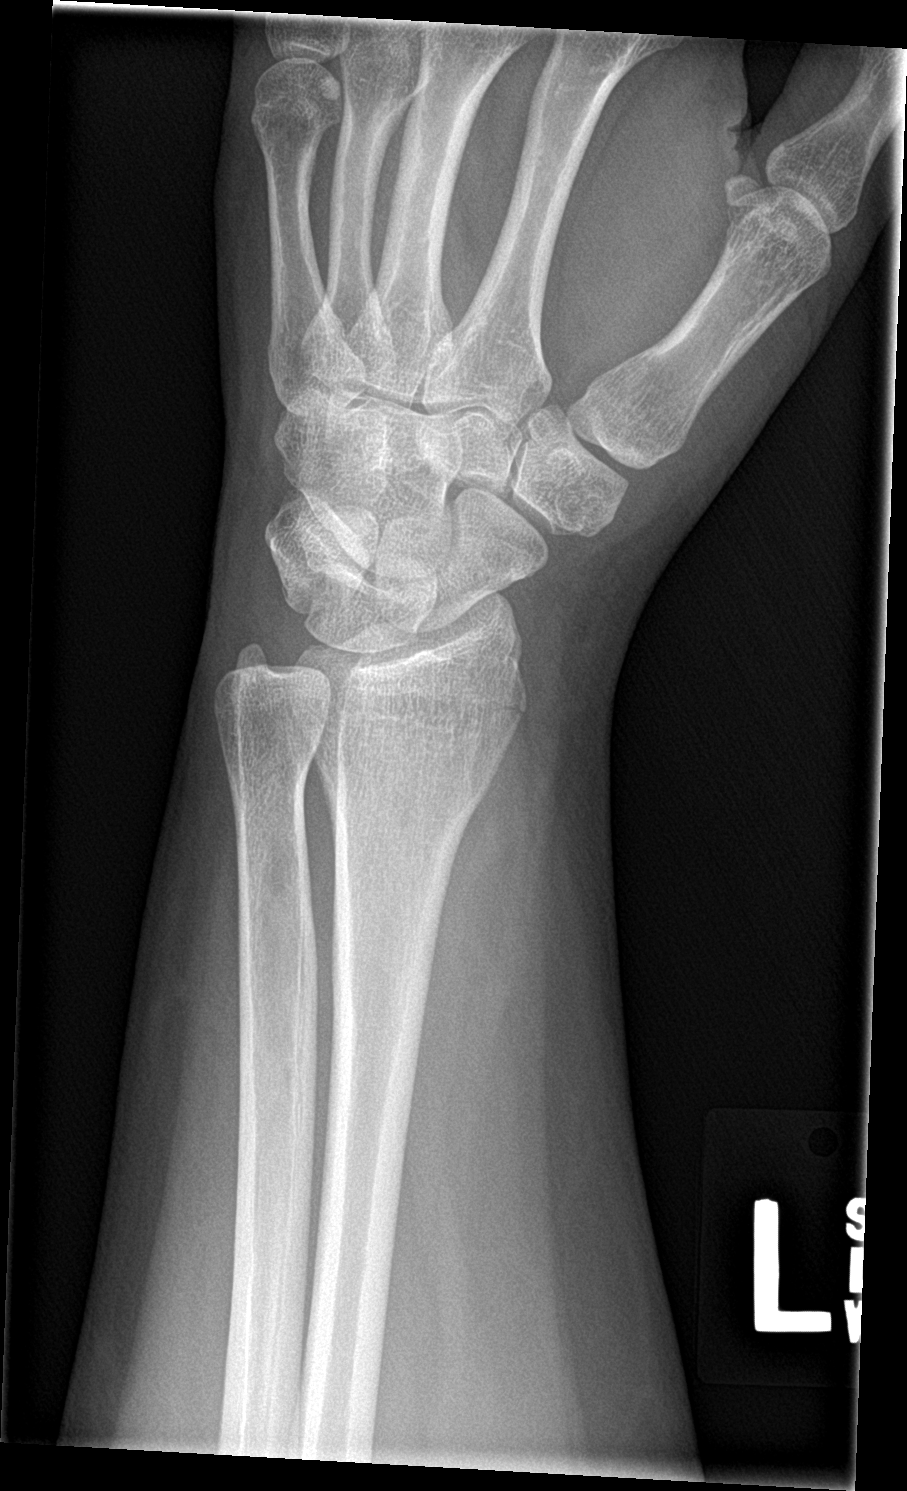

[wrist lat]
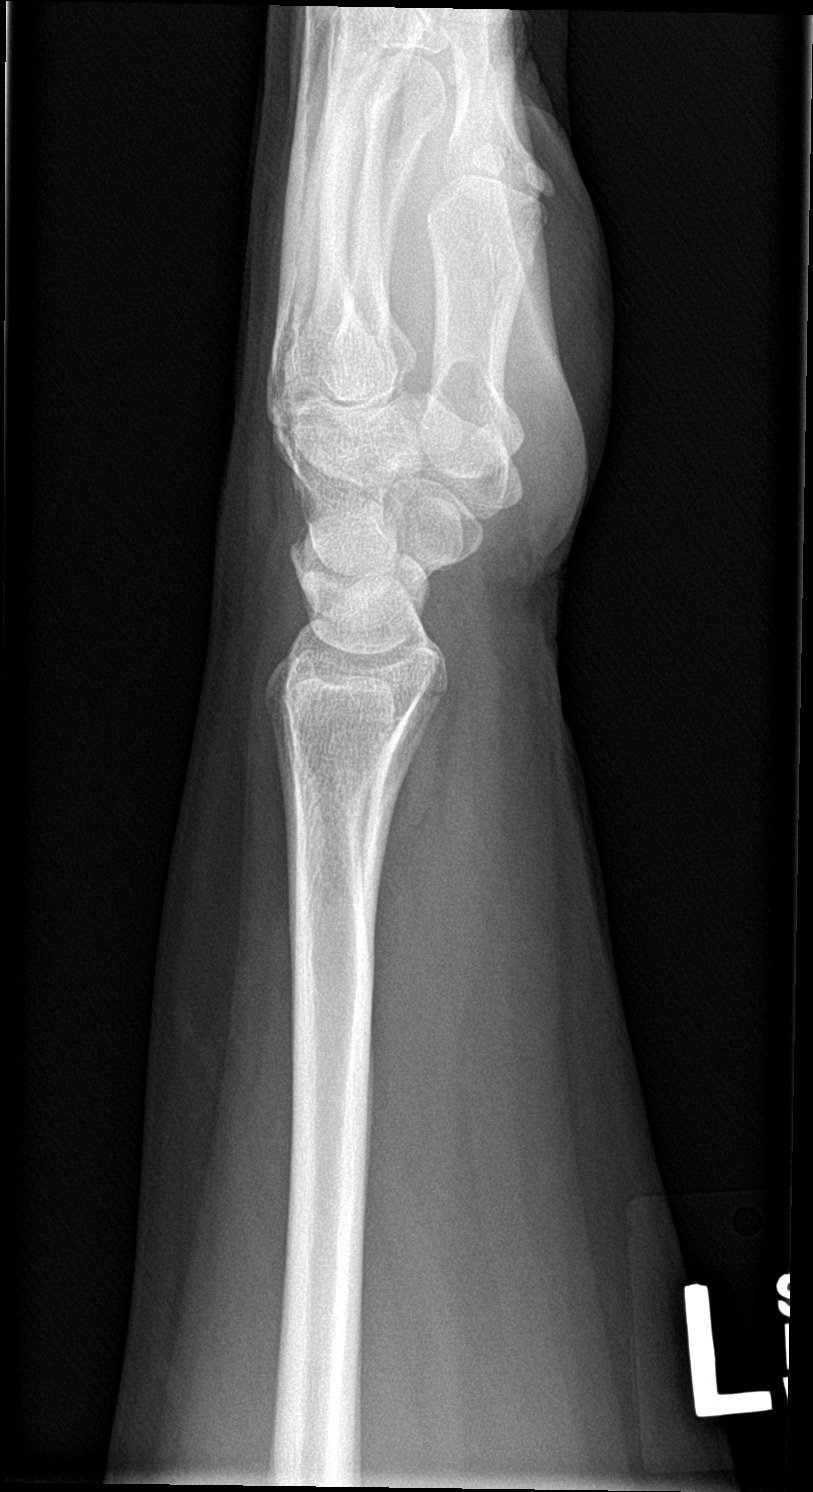

[wrist navicular]
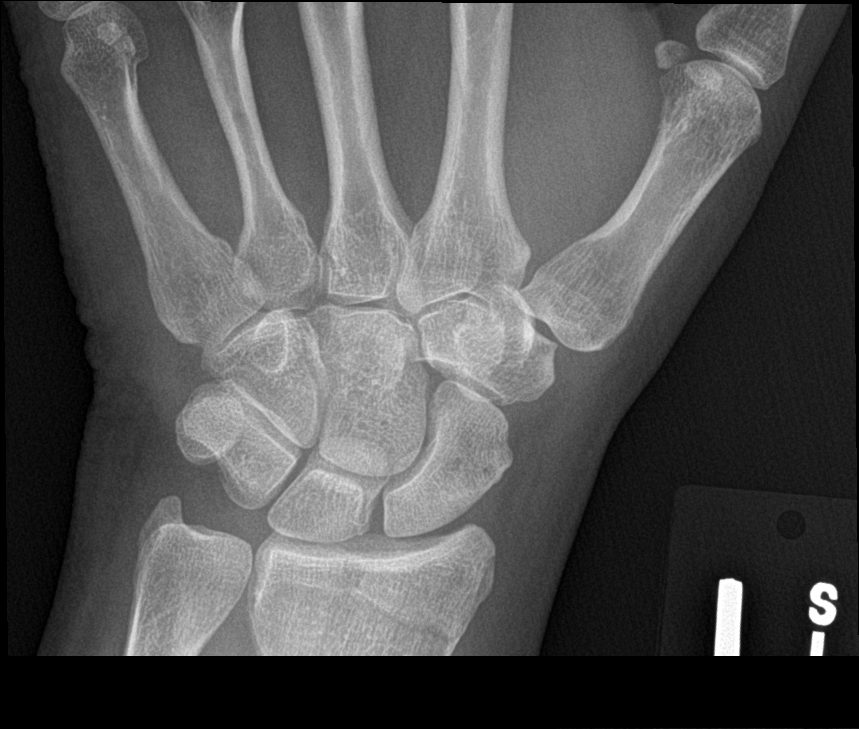

[4 of 4 positions shown; findings below may reference images not displayed]

FINDINGS: Frontal, oblique, lateral, and ulnar deviation scaphoid images were
obtained. The previously noted transverse fracture of the distal
radial metaphysis remains anatomic in alignment without change.
There is no evident new fracture. No dislocation. Joint spaces
appear normal. No erosive change.
IMPRESSION: Stable transverse fracture of the distal radial metaphysis with
alignment anatomic. No new fracture. No dislocation. No evident
arthropathy.

These results will be called to the ordering clinician or
representative by the Radiologist Assistant, and communication
documented in the PACS or zVision Dashboard.
